# Patient Record
Sex: Female | Born: 1965 | Race: White | Hispanic: No | Smoking: Former smoker
Health system: Southern US, Community
[De-identification: ages and names within clinical notes are randomized; demographics above are authoritative.]

## PROBLEM LIST (undated history)

## (undated) DIAGNOSIS — K219 Gastro-esophageal reflux disease without esophagitis: Secondary | ICD-10-CM

## (undated) DIAGNOSIS — J45909 Unspecified asthma, uncomplicated: Secondary | ICD-10-CM

## (undated) DIAGNOSIS — J449 Chronic obstructive pulmonary disease, unspecified: Secondary | ICD-10-CM

## (undated) HISTORY — PX: TUBAL LIGATION: SHX77

## (undated) HISTORY — PX: BONY PELVIS SURGERY: SHX572

---

## 2003-05-15 ENCOUNTER — Emergency Department (HOSPITAL_COMMUNITY): Admission: EM | Admit: 2003-05-15 | Discharge: 2003-05-15 | Payer: Self-pay | Admitting: Emergency Medicine

## 2005-06-04 ENCOUNTER — Ambulatory Visit: Payer: Self-pay | Admitting: Physical Medicine & Rehabilitation

## 2005-06-04 ENCOUNTER — Inpatient Hospital Stay (HOSPITAL_COMMUNITY): Admission: EM | Admit: 2005-06-04 | Discharge: 2005-06-10 | Payer: Self-pay | Admitting: *Deleted

## 2010-03-18 ENCOUNTER — Encounter: Payer: Self-pay | Admitting: Pulmonary Disease

## 2010-03-18 DIAGNOSIS — J449 Chronic obstructive pulmonary disease, unspecified: Secondary | ICD-10-CM

## 2010-03-18 DIAGNOSIS — J45909 Unspecified asthma, uncomplicated: Secondary | ICD-10-CM | POA: Insufficient documentation

## 2010-03-18 DIAGNOSIS — J4489 Other specified chronic obstructive pulmonary disease: Secondary | ICD-10-CM

## 2010-03-18 HISTORY — DX: Chronic obstructive pulmonary disease, unspecified: J44.9

## 2010-03-18 HISTORY — DX: Other specified chronic obstructive pulmonary disease: J44.89

## 2010-03-18 HISTORY — DX: Unspecified asthma, uncomplicated: J45.909

## 2010-03-20 ENCOUNTER — Telehealth: Payer: Self-pay | Admitting: Pulmonary Disease
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
## 2012-10-11 ENCOUNTER — Encounter: Payer: Self-pay | Admitting: Orthopedic Surgery

## 2012-10-12 ENCOUNTER — Ambulatory Visit (HOSPITAL_COMMUNITY): Payer: Self-pay

## 2012-10-12 ENCOUNTER — Ambulatory Visit (HOSPITAL_COMMUNITY): Payer: Self-pay | Admitting: Anesthesiology

## 2012-10-12 ENCOUNTER — Encounter (HOSPITAL_COMMUNITY): Payer: Self-pay | Admitting: Anesthesiology

## 2012-10-12 ENCOUNTER — Encounter (HOSPITAL_COMMUNITY): Payer: Self-pay | Admitting: *Deleted

## 2012-10-12 ENCOUNTER — Ambulatory Visit (HOSPITAL_COMMUNITY)
Admission: RE | Admit: 2012-10-12 | Discharge: 2012-10-13 | Disposition: A | Discharge status: Home / Self Care | Payer: Self-pay | Source: Ambulatory Visit | Attending: Orthopedic Surgery | Admitting: Orthopedic Surgery

## 2012-10-12 ENCOUNTER — Encounter (HOSPITAL_COMMUNITY): Payer: Self-pay | Admitting: Pharmacy Technician

## 2012-10-12 ENCOUNTER — Encounter (HOSPITAL_COMMUNITY)
Admission: RE | Disposition: A | Discharge status: Home / Self Care | Payer: Self-pay | Source: Ambulatory Visit | Attending: Orthopedic Surgery

## 2012-10-12 DIAGNOSIS — J45909 Unspecified asthma, uncomplicated: Secondary | ICD-10-CM | POA: Diagnosis present

## 2012-10-12 DIAGNOSIS — S42201A Unspecified fracture of upper end of right humerus, initial encounter for closed fracture: Secondary | ICD-10-CM

## 2012-10-12 DIAGNOSIS — S42209A Unspecified fracture of upper end of unspecified humerus, initial encounter for closed fracture: Secondary | ICD-10-CM

## 2012-10-12 DIAGNOSIS — J4489 Other specified chronic obstructive pulmonary disease: Secondary | ICD-10-CM | POA: Insufficient documentation

## 2012-10-12 DIAGNOSIS — F17200 Nicotine dependence, unspecified, uncomplicated: Secondary | ICD-10-CM | POA: Insufficient documentation

## 2012-10-12 DIAGNOSIS — W19XXXA Unspecified fall, initial encounter: Secondary | ICD-10-CM | POA: Insufficient documentation

## 2012-10-12 DIAGNOSIS — S42253A Displaced fracture of greater tuberosity of unspecified humerus, initial encounter for closed fracture: Secondary | ICD-10-CM | POA: Insufficient documentation

## 2012-10-12 DIAGNOSIS — J449 Chronic obstructive pulmonary disease, unspecified: Secondary | ICD-10-CM | POA: Insufficient documentation

## 2012-10-12 HISTORY — DX: Gastro-esophageal reflux disease without esophagitis: K21.9

## 2012-10-12 HISTORY — DX: Unspecified asthma, uncomplicated: J45.909

## 2012-10-12 HISTORY — DX: Chronic obstructive pulmonary disease, unspecified: J44.9

## 2012-10-12 HISTORY — PX: ORIF HUMERUS FRACTURE: SHX2126

## 2012-10-12 LAB — URINALYSIS, ROUTINE W REFLEX MICROSCOPIC
Glucose, UA: NEGATIVE mg/dL
Ketones, ur: NEGATIVE mg/dL
Protein, ur: NEGATIVE mg/dL
pH: 5 (ref 5.0–8.0)

## 2012-10-12 LAB — SURGICAL PCR SCREEN
MRSA, PCR: NEGATIVE
Staphylococcus aureus: NEGATIVE

## 2012-10-12 LAB — CBC WITH DIFFERENTIAL/PLATELET
Eosinophils Absolute: 0.4 10*3/uL (ref 0.0–0.7)
Hemoglobin: 15.9 g/dL — ABNORMAL HIGH (ref 12.0–15.0)
Lymphocytes Relative: 33 % (ref 12–46)
Lymphs Abs: 4.8 10*3/uL — ABNORMAL HIGH (ref 0.7–4.0)
MCH: 32.9 pg (ref 26.0–34.0)
Monocytes Relative: 6 % (ref 3–12)
Neutro Abs: 8.5 10*3/uL — ABNORMAL HIGH (ref 1.7–7.7)
Neutrophils Relative %: 58 % (ref 43–77)
RBC: 4.84 MIL/uL (ref 3.87–5.11)
WBC: 14.7 10*3/uL — ABNORMAL HIGH (ref 4.0–10.5)

## 2012-10-12 LAB — URINE MICROSCOPIC-ADD ON

## 2012-10-12 LAB — COMPREHENSIVE METABOLIC PANEL
ALT: 15 U/L (ref 0–35)
Alkaline Phosphatase: 103 U/L (ref 39–117)
BUN: 18 mg/dL (ref 6–23)
CO2: 26 mEq/L (ref 19–32)
Chloride: 100 mEq/L (ref 96–112)
GFR calc Af Amer: >90 mL/min (ref >90)
Glucose, Bld: 81 mg/dL (ref 70–99)
Potassium: 3.9 mEq/L (ref 3.5–5.1)
Total Bilirubin: 0.3 mg/dL (ref 0.3–1.2)

## 2012-10-12 LAB — PROTIME-INR: Prothrombin Time: 13.2 seconds (ref 11.6–15.2)

## 2012-10-12 SURGERY — OPEN REDUCTION INTERNAL FIXATION (ORIF) PROXIMAL HUMERUS FRACTURE
Anesthesia: General | Site: Shoulder | Laterality: Right | Wound class: Clean

## 2012-10-12 MED ORDER — FLUOXETINE HCL 10 MG PO CAPS
10.0000 mg | ORAL_CAPSULE | Freq: Every day | ORAL | Status: DC
Start: 2012-10-12 — End: 2012-10-13
  Administered 2012-10-12 – 2012-10-13 (×2): 10 mg via ORAL
  Filled 2012-10-12 (×2): qty 1

## 2012-10-12 MED ORDER — HYDROMORPHONE HCL PF 1 MG/ML IJ SOLN
INTRAMUSCULAR | Status: AC
  Filled 2012-10-12: qty 1

## 2012-10-12 MED ORDER — ARTIFICIAL TEARS OP OINT
TOPICAL_OINTMENT | OPHTHALMIC | Status: DC | PRN
  Administered 2012-10-12: 1 via OPHTHALMIC

## 2012-10-12 MED ORDER — FENTANYL CITRATE 0.05 MG/ML IJ SOLN
25.0000 ug | INTRAMUSCULAR | Status: DC | PRN
  Administered 2012-10-12 (×4): 50 ug via INTRAVENOUS

## 2012-10-12 MED ORDER — LIDOCAINE HCL (CARDIAC) 20 MG/ML IV SOLN
INTRAVENOUS | Status: DC | PRN
  Administered 2012-10-12: 80 mg via INTRAVENOUS

## 2012-10-12 MED ORDER — PNEUMOCOCCAL VAC POLYVALENT 25 MCG/0.5ML IJ INJ
0.5000 mL | INJECTION | INTRAMUSCULAR | Status: DC
  Filled 2012-10-12: qty 0.5

## 2012-10-12 MED ORDER — GLYCOPYRROLATE 0.2 MG/ML IJ SOLN
INTRAMUSCULAR | Status: DC | PRN
  Administered 2012-10-12: 0.4 mg via INTRAVENOUS

## 2012-10-12 MED ORDER — LACTATED RINGERS IV SOLN: INTRAVENOUS | Status: DC

## 2012-10-12 MED ORDER — ACETAMINOPHEN 10 MG/ML IV SOLN: 1000.0000 mg | Freq: Once | INTRAVENOUS | Status: DC

## 2012-10-12 MED ORDER — HYDROMORPHONE HCL PF 1 MG/ML IJ SOLN
0.5000 mg | INTRAMUSCULAR | Status: DC | PRN
  Administered 2012-10-12 – 2012-10-13 (×3): 1 mg via INTRAVENOUS
  Filled 2012-10-12 (×3): qty 1

## 2012-10-12 MED ORDER — METHOCARBAMOL 100 MG/ML IJ SOLN
500.0000 mg | Freq: Once | INTRAVENOUS | Status: DC
  Filled 2012-10-12: qty 5

## 2012-10-12 MED ORDER — LACTATED RINGERS IV SOLN
INTRAVENOUS | Status: DC | PRN
  Administered 2012-10-12 (×2): via INTRAVENOUS

## 2012-10-12 MED ORDER — ONDANSETRON HCL 4 MG PO TABS: 4.0000 mg | ORAL_TABLET | Freq: Four times a day (QID) | ORAL | Status: DC | PRN

## 2012-10-12 MED ORDER — METOCLOPRAMIDE HCL 5 MG/ML IJ SOLN
5.0000 mg | Freq: Three times a day (TID) | INTRAMUSCULAR | Status: DC | PRN
  Administered 2012-10-12: 10 mg via INTRAVENOUS
  Filled 2012-10-12: qty 2

## 2012-10-12 MED ORDER — LIDOCAINE HCL 4 % MT SOLN
OROMUCOSAL | Status: DC | PRN
  Administered 2012-10-12: 4 mL via TOPICAL

## 2012-10-12 MED ORDER — ALBUMIN HUMAN 5 % IV SOLN
INTRAVENOUS | Status: DC | PRN
  Administered 2012-10-12: 09:00:00 via INTRAVENOUS

## 2012-10-12 MED ORDER — INFLUENZA VIRUS VACC SPLIT PF IM SUSP
0.5000 mL | INTRAMUSCULAR | Status: DC
  Filled 2012-10-12: qty 0.5

## 2012-10-12 MED ORDER — CEFAZOLIN SODIUM 1-5 GM-% IV SOLN
1.0000 g | Freq: Three times a day (TID) | INTRAVENOUS | Status: AC
  Administered 2012-10-12 – 2012-10-13 (×3): 1 g via INTRAVENOUS
  Filled 2012-10-12 (×4): qty 50

## 2012-10-12 MED ORDER — MUPIROCIN 2 % EX OINT
TOPICAL_OINTMENT | CUTANEOUS | Status: AC
  Filled 2012-10-12: qty 22

## 2012-10-12 MED ORDER — OXYCODONE-ACETAMINOPHEN 5-325 MG PO TABS
1.0000 | ORAL_TABLET | Freq: Four times a day (QID) | ORAL | Status: DC | PRN
  Administered 2012-10-12 – 2012-10-13 (×2): 2 via ORAL
  Filled 2012-10-12 (×2): qty 2

## 2012-10-12 MED ORDER — HYDROMORPHONE HCL PF 1 MG/ML IJ SOLN
INTRAMUSCULAR | Status: AC
  Administered 2012-10-12: 14:00:00
  Filled 2012-10-12: qty 1

## 2012-10-12 MED ORDER — CEFAZOLIN SODIUM-DEXTROSE 2-3 GM-% IV SOLR
INTRAVENOUS | Status: AC
  Administered 2012-10-12: 2000 mg
  Filled 2012-10-12: qty 50

## 2012-10-12 MED ORDER — ALBUTEROL SULFATE (5 MG/ML) 0.5% IN NEBU: 2.5000 mg | INHALATION_SOLUTION | Freq: Four times a day (QID) | RESPIRATORY_TRACT | Status: DC | PRN

## 2012-10-12 MED ORDER — HYDROXYZINE HCL 25 MG PO TABS: 50.0000 mg | ORAL_TABLET | Freq: Three times a day (TID) | ORAL | Status: DC | PRN

## 2012-10-12 MED ORDER — CEFAZOLIN SODIUM-DEXTROSE 2-3 GM-% IV SOLR
2.0000 g | INTRAVENOUS | Status: AC
  Administered 2012-10-12: 2 g via INTRAVENOUS

## 2012-10-12 MED ORDER — METHOCARBAMOL 500 MG PO TABS
500.0000 mg | ORAL_TABLET | Freq: Four times a day (QID) | ORAL | Status: DC
  Administered 2012-10-12: 500 mg via ORAL
  Administered 2012-10-12 – 2012-10-13 (×2): 1000 mg via ORAL
  Filled 2012-10-12 (×6): qty 2
  Filled 2012-10-12: qty 1

## 2012-10-12 MED ORDER — FENTANYL CITRATE 0.05 MG/ML IJ SOLN
INTRAMUSCULAR | Status: AC
Start: 2012-10-12 — End: 2012-10-12
  Filled 2012-10-12: qty 2

## 2012-10-12 MED ORDER — FENTANYL CITRATE 0.05 MG/ML IJ SOLN
INTRAMUSCULAR | Status: AC
  Filled 2012-10-12: qty 2

## 2012-10-12 MED ORDER — 0.9 % SODIUM CHLORIDE (POUR BTL) OPTIME
TOPICAL | Status: DC | PRN
  Administered 2012-10-12: 1000 mL

## 2012-10-12 MED ORDER — ALBUTEROL SULFATE HFA 108 (90 BASE) MCG/ACT IN AERS
INHALATION_SPRAY | RESPIRATORY_TRACT | Status: DC | PRN
  Administered 2012-10-12: 5 via RESPIRATORY_TRACT

## 2012-10-12 MED ORDER — MUPIROCIN 2 % EX OINT
TOPICAL_OINTMENT | Freq: Once | CUTANEOUS | Status: AC
  Administered 2012-10-12: 07:00:00 via NASAL

## 2012-10-12 MED ORDER — ALUM & MAG HYDROXIDE-SIMETH 200-200-20 MG/5ML PO SUSP: 30.0000 mL | ORAL | Status: DC | PRN

## 2012-10-12 MED ORDER — PHENYLEPHRINE HCL 10 MG/ML IJ SOLN
INTRAMUSCULAR | Status: DC | PRN
  Administered 2012-10-12 (×3): 40 ug via INTRAVENOUS
  Administered 2012-10-12: 80 ug via INTRAVENOUS

## 2012-10-12 MED ORDER — MIDAZOLAM HCL 5 MG/5ML IJ SOLN
INTRAMUSCULAR | Status: DC | PRN
  Administered 2012-10-12: 2 mg via INTRAVENOUS

## 2012-10-12 MED ORDER — ROCURONIUM BROMIDE 100 MG/10ML IV SOLN
INTRAVENOUS | Status: DC | PRN
  Administered 2012-10-12: 50 mg via INTRAVENOUS

## 2012-10-12 MED ORDER — MENTHOL 3 MG MT LOZG: 1.0000 | LOZENGE | OROMUCOSAL | Status: DC | PRN

## 2012-10-12 MED ORDER — ACETAMINOPHEN 10 MG/ML IV SOLN
INTRAVENOUS | Status: AC
  Administered 2012-10-12: 1000 mg via INTRAVENOUS
  Filled 2012-10-12: qty 100

## 2012-10-12 MED ORDER — PHENOL 1.4 % MT LIQD: 1.0000 | OROMUCOSAL | Status: DC | PRN

## 2012-10-12 MED ORDER — POTASSIUM CHLORIDE IN NACL 20-0.9 MEQ/L-% IV SOLN
INTRAVENOUS | Status: DC
  Administered 2012-10-12: 22:00:00 via INTRAVENOUS
  Filled 2012-10-12 (×2): qty 1000

## 2012-10-12 MED ORDER — OXYCODONE HCL 5 MG PO TABS
ORAL_TABLET | ORAL | Status: AC
  Filled 2012-10-12: qty 1

## 2012-10-12 MED ORDER — OXYCODONE HCL 5 MG PO TABS
5.0000 mg | ORAL_TABLET | ORAL | Status: DC | PRN
  Administered 2012-10-12 (×2): 15 mg via ORAL
  Administered 2012-10-12: 5 mg via ORAL
  Administered 2012-10-13 (×3): 15 mg via ORAL
  Filled 2012-10-12 (×5): qty 3

## 2012-10-12 MED ORDER — VECURONIUM BROMIDE 10 MG IV SOLR
INTRAVENOUS | Status: DC | PRN
  Administered 2012-10-12 (×2): 3 mg via INTRAVENOUS

## 2012-10-12 MED ORDER — METHOCARBAMOL 100 MG/ML IJ SOLN
500.0000 mg | Freq: Four times a day (QID) | INTRAVENOUS | Status: DC
  Administered 2012-10-12 – 2012-10-13 (×2): 500 mg via INTRAVENOUS
  Filled 2012-10-12 (×6): qty 5

## 2012-10-12 MED ORDER — PROPOFOL 10 MG/ML IV BOLUS
INTRAVENOUS | Status: DC | PRN
  Administered 2012-10-12: 170 mg via INTRAVENOUS

## 2012-10-12 MED ORDER — FENTANYL CITRATE 0.05 MG/ML IJ SOLN
INTRAMUSCULAR | Status: DC | PRN
  Administered 2012-10-12 (×8): 50 ug via INTRAVENOUS

## 2012-10-12 MED ORDER — ONDANSETRON HCL 4 MG/2ML IJ SOLN
INTRAMUSCULAR | Status: DC | PRN
  Administered 2012-10-12: 4 mg via INTRAVENOUS

## 2012-10-12 MED ORDER — ALBUTEROL SULFATE HFA 108 (90 BASE) MCG/ACT IN AERS
2.0000 | INHALATION_SPRAY | RESPIRATORY_TRACT | Status: DC | PRN
  Administered 2012-10-12: 2 via RESPIRATORY_TRACT

## 2012-10-12 MED ORDER — METOCLOPRAMIDE HCL 5 MG PO TABS
5.0000 mg | ORAL_TABLET | Freq: Three times a day (TID) | ORAL | Status: DC | PRN
  Filled 2012-10-12: qty 2

## 2012-10-12 MED ORDER — GENTAMICIN IN SALINE 1.6-0.9 MG/ML-% IV SOLN
80.0000 mg | Freq: Once | INTRAVENOUS | Status: AC
  Administered 2012-10-12: 80 mg via INTRAVENOUS
  Filled 2012-10-12: qty 50

## 2012-10-12 MED ORDER — NEOSTIGMINE METHYLSULFATE 1 MG/ML IJ SOLN
INTRAMUSCULAR | Status: DC | PRN
  Administered 2012-10-12: 3 mg via INTRAVENOUS

## 2012-10-12 MED ORDER — DOCUSATE SODIUM 100 MG PO CAPS
100.0000 mg | ORAL_CAPSULE | Freq: Two times a day (BID) | ORAL | Status: DC
  Administered 2012-10-12 (×2): 100 mg via ORAL
  Filled 2012-10-12 (×4): qty 1

## 2012-10-12 MED ORDER — ONDANSETRON HCL 4 MG/2ML IJ SOLN
4.0000 mg | Freq: Four times a day (QID) | INTRAMUSCULAR | Status: DC | PRN
  Administered 2012-10-12 – 2012-10-13 (×2): 4 mg via INTRAVENOUS
  Filled 2012-10-12 (×2): qty 2

## 2012-10-12 SURGICAL SUPPLY — 65 items
APL SKNCLS STERI-STRIP NONHPOA (GAUZE/BANDAGES/DRESSINGS) ×1
BANDAGE GAUZE ELAST BULKY 4 IN (GAUZE/BANDAGES/DRESSINGS) ×2 IMPLANT
BENZOIN TINCTURE PRP APPL 2/3 (GAUZE/BANDAGES/DRESSINGS) ×3 IMPLANT
BIT DRILL 2.5X2.75 QC CALB (BIT) ×1 IMPLANT
BONE CHIP PRESERV 20CC (Bone Implant) ×1 IMPLANT
BRUSH SCRUB DISP (MISCELLANEOUS) ×4 IMPLANT
CLOTH BEACON ORANGE TIMEOUT ST (SAFETY) ×2 IMPLANT
COVER SURGICAL LIGHT HANDLE (MISCELLANEOUS) ×3 IMPLANT
DRAPE C-ARM 42X72 X-RAY (DRAPES) ×2 IMPLANT
DRAPE C-ARMOR (DRAPES) ×1 IMPLANT
DRAPE INCISE IOBAN 66X45 STRL (DRAPES) IMPLANT
DRAPE ORTHO SPLIT 77X108 STRL (DRAPES) ×4
DRAPE SURG 17X11 SM STRL (DRAPES) ×3 IMPLANT
DRAPE SURG ORHT 6 SPLT 77X108 (DRAPES) ×2 IMPLANT
DRAPE U-SHAPE 47X51 STRL (DRAPES) ×3 IMPLANT
DRILL BIT 5/64 (BIT) ×1 IMPLANT
DRSG ADAPTIC 3X8 NADH LF (GAUZE/BANDAGES/DRESSINGS) ×1 IMPLANT
DRSG MEPILEX BORDER 4X8 (GAUZE/BANDAGES/DRESSINGS) ×1 IMPLANT
DRSG PAD ABDOMINAL 8X10 ST (GAUZE/BANDAGES/DRESSINGS) ×1 IMPLANT
ELECT REM PT RETURN 9FT ADLT (ELECTROSURGICAL) ×2
ELECTRODE REM PT RTRN 9FT ADLT (ELECTROSURGICAL) ×1 IMPLANT
EVACUATOR 1/8 PVC DRAIN (DRAIN) IMPLANT
GLOVE BIO SURGEON STRL SZ7.5 (GLOVE) ×2 IMPLANT
GLOVE BIO SURGEON STRL SZ8 (GLOVE) ×2 IMPLANT
GLOVE BIOGEL PI IND STRL 7.5 (GLOVE) ×1 IMPLANT
GLOVE BIOGEL PI IND STRL 8 (GLOVE) ×1 IMPLANT
GLOVE BIOGEL PI INDICATOR 7.5 (GLOVE) ×1
GLOVE BIOGEL PI INDICATOR 8 (GLOVE) ×1
GOWN PREVENTION PLUS XLARGE (GOWN DISPOSABLE) ×2 IMPLANT
GOWN PREVENTION PLUS XXLARGE (GOWN DISPOSABLE) ×2 IMPLANT
GOWN STRL NON-REIN LRG LVL3 (GOWN DISPOSABLE) ×3 IMPLANT
KIT BASIN OR (CUSTOM PROCEDURE TRAY) ×2 IMPLANT
KIT ROOM TURNOVER OR (KITS) ×2 IMPLANT
LOOP VESSEL MAXI BLUE (MISCELLANEOUS) IMPLANT
MANIFOLD NEPTUNE II (INSTRUMENTS) ×1 IMPLANT
NS IRRIG 1000ML POUR BTL (IV SOLUTION) ×2 IMPLANT
PACK TOTAL JOINT (CUSTOM PROCEDURE TRAY) ×2 IMPLANT
PAD ARMBOARD 7.5X6 YLW CONV (MISCELLANEOUS) ×4 IMPLANT
PLATE ACE LRG OFST SPDR 20XLWR (Plate) IMPLANT
PLATE ACE SPIDER (Plate) ×2 IMPLANT
RETRIEVER SUT HEWSON (MISCELLANEOUS) ×1 IMPLANT
SCREW CORT FT 32X3.5XNONLOCK (Screw) IMPLANT
SCREW CORTICAL 3.5MM  32MM (Screw) ×1 IMPLANT
SCREW CORTICAL 3.5MM 32MM (Screw) ×1 IMPLANT
SPONGE GAUZE 4X4 12PLY (GAUZE/BANDAGES/DRESSINGS) ×4 IMPLANT
SPONGE LAP 18X18 X RAY DECT (DISPOSABLE) IMPLANT
STAPLER VISISTAT 35W (STAPLE) ×2 IMPLANT
STOCKINETTE IMPERVIOUS LG (DRAPES) ×1 IMPLANT
SUCTION FRAZIER TIP 10 FR DISP (SUCTIONS) ×2 IMPLANT
SUT ETHIBOND 5 LR DA (SUTURE) ×1 IMPLANT
SUT ETHILON 3 0 PS 1 (SUTURE) ×1 IMPLANT
SUT FIBERWIRE #2 38 T-5 BLUE (SUTURE) ×6
SUT PDS AB 2-0 CT1 27 (SUTURE) IMPLANT
SUT VIC AB 0 CT1 27 (SUTURE) ×2
SUT VIC AB 0 CT1 27XBRD ANBCTR (SUTURE) ×2 IMPLANT
SUT VIC AB 2-0 CT1 27 (SUTURE) ×2
SUT VIC AB 2-0 CT1 TAPERPNT 27 (SUTURE) ×2 IMPLANT
SUT VIC AB 2-0 CT3 27 (SUTURE) IMPLANT
SUTURE FIBERWR #2 38 T-5 BLUE (SUTURE) IMPLANT
SYR 5ML LL (SYRINGE) IMPLANT
TOWEL OR 17X24 6PK STRL BLUE (TOWEL DISPOSABLE) ×2 IMPLANT
TOWEL OR 17X26 10 PK STRL BLUE (TOWEL DISPOSABLE) ×4 IMPLANT
TRAY FOLEY CATH 14FR (SET/KITS/TRAYS/PACK) IMPLANT
WATER STERILE IRR 1000ML POUR (IV SOLUTION) ×2 IMPLANT
YANKAUER SUCT BULB TIP NO VENT (SUCTIONS) IMPLANT

## 2012-10-12 NOTE — Anesthesia Postprocedure Evaluation (Signed)
  Anesthesia Post-op Note  Patient: [name]  Procedure(s) Performed: Procedure(s): OPEN REDUCTION INTERNAL FIXATION (ORIF) PROXIMAL HUMERUS FRACTURE (Right)  Patient Location: PACU  Anesthesia Type:General  Level of Consciousness: awake  Airway and Oxygen Therapy: Patient Spontanous Breathing  Post-op Pain: mild  Post-op Assessment: Post-op Vital signs reviewed  Post-op Vital Signs: Reviewed  Complications: No apparent anesthesia complications

## 2012-10-12 NOTE — Anesthesia Procedure Notes (Signed)
Procedure Name: Intubation Date/Time: 10/12/2012 8:04 AM Performed by: Lovie Chol Pre-anesthesia Checklist: Patient identified, Emergency Drugs available, Suction available, Patient being monitored and Timeout performed Patient Re-evaluated:Patient Re-evaluated prior to inductionOxygen Delivery Method: Circle system utilized Preoxygenation: Pre-oxygenation with 100% oxygen Intubation Type: IV induction Ventilation: Mask ventilation without difficulty Laryngoscope Size: Miller and 2 Grade View: Grade I Tube type: Oral Tube size: 7.0 mm Number of attempts: 1 Airway Equipment and Method: Stylet and LTA kit utilized Placement Confirmation: ETT inserted through vocal cords under direct vision,  positive ETCO2,  CO2 detector and breath sounds checked- equal and bilateral Secured at: 22 cm Tube secured with: Tape Dental Injury: Teeth and Oropharynx as per pre-operative assessment

## 2012-10-12 NOTE — Preoperative (Signed)
Beta Blockers   Reason not to administer Beta Blockers:Not Applicable 
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
## 2012-10-12 NOTE — Anesthesia Preprocedure Evaluation (Addendum)
Anesthesia Evaluation  Patient identified by MRN, date of birth, ID band Patient awake    Reviewed: Allergy & Precautions, H&P , NPO status , Patient's Chart, lab work & pertinent test results  History of Anesthesia Complications Negative for: history of anesthetic complications  Airway Mallampati: II TM Distance: >3 FB Neck ROM: Full    Dental  (+) Teeth Intact and Dental Advisory Given   Pulmonary shortness of breath, with exertion and at rest, asthma , COPD COPD inhaler,  Patient reports asthma attack this am -- used nebulizer. breath sounds clear to auscultation        Cardiovascular negative cardio ROS  Rhythm:Regular Rate:Normal     Neuro/Psych negative neurological ROS     GI/Hepatic Neg liver ROS, GERD-  Controlled,  Endo/Other  negative endocrine ROS  Renal/GU negative Renal ROS  negative genitourinary   Musculoskeletal   Abdominal   Peds  Hematology negative hematology ROS (+)   Anesthesia Other Findings   Reproductive/Obstetrics Perimenopause per patient.                        Anesthesia Physical Anesthesia Plan  ASA: III  Anesthesia Plan: General   Post-op Pain Management:    Induction: Intravenous  Airway Management Planned: Oral ETT  Additional Equipment:   Intra-op Plan:   Post-operative Plan: Extubation in OR  Informed Consent: I have reviewed the patients History and Physical, chart, labs and discussed the procedure including the risks, benefits and alternatives for the proposed anesthesia with the patient or authorized representative who has indicated his/her understanding and acceptance.   Dental advisory given  Plan Discussed with: CRNA and Anesthesiologist  Anesthesia Plan Comments:        Anesthesia Quick Evaluation

## 2012-10-12 NOTE — Progress Notes (Signed)
Pt still having c/o pain 10/10, Dr.Edwards notified, order rec'd to give additional fentanyl and states she will com to eval patient, will cont to assess

## 2012-10-12 NOTE — Progress Notes (Signed)
Dr. Randa Evens notified about the results of EKG.

## 2012-10-12 NOTE — Transfer of Care (Signed)
Immediate Anesthesia Transfer of Care Note  Patient: [name]  Procedure(s) Performed: Procedure(s): OPEN REDUCTION INTERNAL FIXATION (ORIF) PROXIMAL HUMERUS FRACTURE (Right)  Patient Location: PACU  Anesthesia Type:General  Level of Consciousness: awake, alert , oriented and patient cooperative  Airway & Oxygen Therapy: Patient Spontanous Breathing and Patient connected to nasal cannula oxygen  Post-op Assessment: Report given to PACU RN and Post -op Vital signs reviewed and stable  Post vital signs: Reviewed and stable  Complications: No apparent anesthesia complications

## 2012-10-12 NOTE — Brief Op Note (Signed)
10/12/2012  10:49 AM  PATIENT:  [name]  47 y.o. female  PRE-OPERATIVE DIAGNOSIS:  right proximal humerus fracture  POST-OPERATIVE DIAGNOSIS:  right proximal humerus fracture  PROCEDURE:  Procedure(s): OPEN REDUCTION INTERNAL FIXATION (ORIF) PROXIMAL HUMERUS FRACTURE (Right)  SURGEON:  Surgeon(s) and Role:    * Budd Palmer, MD - Primary  PHYSICIAN ASSISTANT: Montez Morita, [facility]  ANESTHESIA:   general  EBL:  Total I/O In: 1250 [I.V.:1000; IV Piggyback:250] Out: -   BLOOD ADMINISTERED:none  DRAINS: none   LOCAL MEDICATIONS USED:  NONE  SPECIMEN:  No Specimen  DISPOSITION OF SPECIMEN:  N/A  COUNTS:  YES  TOURNIQUET:  * No tourniquets in log *  DICTATION: .Other Dictation: Dictation Number [id number]  PLAN OF CARE: Admit for overnight observation  PATIENT DISPOSITION:  PACU - hemodynamically stable.   Delay start of Pharmacological VTE agent (>24hrs) due to surgical blood loss or risk of bleeding: no

## 2012-10-13 LAB — URINE DRUGS OF ABUSE SCREEN W ALC, ROUTINE (REF LAB)
Amphetamine Screen, Ur: NEGATIVE
Barbiturate Quant, Ur: NEGATIVE
Benzodiazepines.: NEGATIVE
Cocaine Metabolites: NEGATIVE
Creatinine,U: 213 mg/dL
Ethyl Alcohol: <10 mg/dL (ref <10)
Marijuana Metabolite: POSITIVE — AB
Methadone: NEGATIVE
Opiate Screen, Urine: NEGATIVE
Phencyclidine (PCP): NEGATIVE
Propoxyphene: NEGATIVE

## 2012-10-13 LAB — URINE CULTURE
Colony Count: NO GROWTH
Culture: NO GROWTH

## 2012-10-13 MED ORDER — METHOCARBAMOL 500 MG PO TABS: 500.0000 mg | ORAL_TABLET | Freq: Four times a day (QID) | ORAL | Status: DC

## 2012-10-13 MED ORDER — OXYCODONE HCL 5 MG PO TABS: 5.0000 mg | ORAL_TABLET | ORAL | Status: DC | PRN

## 2012-10-13 MED ORDER — OXYCODONE-ACETAMINOPHEN 5-325 MG PO TABS: 1.0000 | ORAL_TABLET | Freq: Four times a day (QID) | ORAL | Status: AC | PRN

## 2012-10-13 NOTE — Evaluation (Signed)
Occupational Therapy Evaluation Patient Details Name: [name] MRN: [id number] DOB: [birth year] Today's Date: 10/13/2012 Time: 7829-5621 OT Time Calculation (min): 36 min  OT Assessment / Plan / Recommendation Clinical Impression  Pt is a 47 yr old female admitted secondary to fall and subsequently a right humeral fracture.  Pt overall needs min asssit for basic selfcare tasks secondary to RUE limitations.  Will have assistance from family at discharge.  She has been educated on positioning, sling wear, selfcare, AROM for non-involved joints and Pendulum exercises.  No further acute care OT needs.    OT Assessment  Progress rehab of shoulder as ordered by MD at follow-up appointment    Follow Up Recommendations  No OT follow up       Equipment Recommendations  None recommended by OT          Precautions / Restrictions Precautions Precaution Comments: shoulder sling at all times except for exeercise and selfcare Required Braces or Orthoses: Other Brace/Splint Other Brace/Splint: sling Restrictions Weight Bearing Restrictions: Yes RUE Weight Bearing: Non weight bearing   Pertinent Vitals/Pain HR 93 and O2 sats 95% on room air    ADL  Eating/Feeding: Performed;Modified independent Where Assessed - Eating/Feeding: Chair Grooming: Simulated;Minimal assistance Where Assessed - Grooming: Unsupported standing Upper Body Bathing: Simulated;Minimal assistance Where Assessed - Upper Body Bathing: Unsupported sitting Lower Body Bathing: Simulated;Supervision/safety Where Assessed - Lower Body Bathing: Supported sit to stand Upper Body Dressing: Simulated;Minimal assistance Where Assessed - Upper Body Dressing: Unsupported sitting Lower Body Dressing: Simulated;Supervision/safety Where Assessed - Lower Body Dressing: Unsupported sit to stand Toilet Transfer: Simulated;Independent Acupuncturist: Regular height toilet Toileting - Clothing Manipulation and Hygiene:  Simulated;Independent Where Assessed - Toileting Clothing Manipulation and Hygiene: Sit to stand from 3-in-1 or toilet Tub/Shower Transfer: Simulated;Independent Equipment Used: Other (comment) (sling) Transfers/Ambulation Related to ADLs: Pt is currently independent with mobility without need for assistive device. ADL Comments: Pt/family have been educated on positioning, sling wear, selfcare tasks, Pendulums, and AROM exercises for the RUE.  Also encouraged ice for pain throughout the day.  No further needs.      Visit Information  Last OT Received On: 10/13/12 Assistance Needed: +1    Subjective Data  Subjective: I want to get home, I'm tired of the hospital. Patient Stated Goal: Did not state   Prior Functioning     Home Living Lives With: Family Available Help at Discharge: Family Type of Home: Mobile home Home Access: Stairs to enter Secretary/administrator of Steps: 2 Entrance Stairs-Rails: None Home Layout: One level Bathroom Shower/Tub: Engineer, manufacturing systems: Standard Home Adaptive Equipment: None Prior Function Level of Independence: Independent Able to Take Stairs?: Yes Driving: Yes Communication Communication: No difficulties Dominant Hand: Right         Vision/Perception Vision - History Baseline Vision: No visual deficits Patient Visual Report: No change from baseline Vision - Assessment Eye Alignment: Within Functional Limits Vision Assessment: Vision not tested Perception Perception: Within Functional Limits Praxis Praxis: Intact   Cognition  Cognition Overall Cognitive Status: Appears within functional limits for tasks assessed/performed Arousal/Alertness: Awake/alert Orientation Level: Appears intact for tasks assessed Behavior During Session: [facility] for tasks performed Cognition - Other Comments: pt very anxious    Extremity/Trunk Assessment Right Upper Extremity Assessment RUE ROM/Strength/Tone: Unable to fully assess;Due to  pain;Due to precautions;Deficits RUE ROM/Strength/Tone Deficits: pt with UE in sling.  Full wrist and hand AROM noted.  AROM elbow flexion -30-120 degrees currently RUE Sensation: WFL - Light Touch RUE  Coordination: WFL - gross/fine motor Left Upper Extremity Assessment LUE ROM/Strength/Tone: Within functional levels LUE Sensation: WFL - Light Touch LUE Coordination: WFL - gross/fine motor Trunk Assessment Trunk Assessment: Normal     Mobility Bed Mobility Bed Mobility: Supine to Sit Supine to Sit: 6: Modified independent (Device/Increase time) Transfers Transfers: Sit to Stand;Stand to Sit Sit to Stand: 7: Independent;Without upper extremity assist;From bed Stand to Sit: 7: Independent;Without upper extremity assist;To bed     Exercise Shoulder Exercises Pendulum Exercise: PROM;Right;15 reps;Standing Elbow Flexion: AAROM;Right;15 reps;Standing Wrist Flexion: AROM;15 reps;Standing;Right Wrist Extension: AROM;Right;15 reps;Standing Neck Lateral Flexion - Left: AROM;5 reps;Seated Donning/doffing shirt without moving shoulder: Caregiver independent with task Method for sponge bathing under operated UE: Caregiver independent with task Donning/doffing sling/immobilizer: Caregiver independent with task Correct positioning of sling/immobilizer: Caregiver independent with task Pendulum exercises (written home exercise program): Independent ROM for elbow, wrist and digits of operated UE: Independent Sling wearing schedule (on at all times/off for ADL's): Independent;Caregiver independent with task Proper positioning of operated UE when showering: Independent;Caregiver independent with task Dressing change:  (Nursing addressing) Positioning of UE while sleeping: Caregiver independent with task   Balance Balance Balance Assessed: Yes Dynamic Standing Balance Dynamic Standing - Balance Support: No upper extremity supported Dynamic Standing - Level of Assistance: 5: Stand by  assistance;Other (comment) (Pt slightly groggy likley due to pain meds. )   End of Session OT - End of Session Activity Tolerance: Patient limited by pain Patient left: in bed;with call bell/phone within reach Nurse Communication: Mobility status (Completion of OT education)  GO Functional Assessment Tool Used: clinical judgement Functional Limitation: Self care Self Care Current Status (Z6109): At least 1 percent but less than 20 percent impaired, limited or restricted Self Care Goal Status (U0454): At least 1 percent but less than 20 percent impaired, limited or restricted Self Care Discharge Status [phone]): At least 1 percent but less than 20 percent impaired, limited or restricted   Carrianne Hyun OTR/L Pager number [phone] 10/13/2012, 10:37 AM

## 2012-10-13 NOTE — Op Note (Signed)
[name], [name]                 ACCOUNT NO.:  [id number]  MEDICAL RECORD NO.:  [id number]  LOCATION:  [id number]                        FACILITY:  [facility]  PHYSICIAN:  Doralee Albino. Carola Frost, M.D. DATE OF BIRTH:  06-22-1966  DATE OF PROCEDURE: DATE OF DISCHARGE:                              OPERATIVE REPORT   PREOPERATIVE DIAGNOSIS:  Displaced right proximal humerus fracture including greater tuberosity and impaction of the humeral head at the anatomic neck.  POSTOPERATIVE DIAGNOSIS:  Displaced right proximal humerus fracture including greater tuberosity and impaction of the humeral head at the anatomic neck.  PROCEDURE:  Open reduction and internal fixation of right proximal humerus.  SURGEON:  Doralee Albino. Carola Frost, M.D.  ASSISTANT:  Mearl Latin, PA-C  ANESTHESIA:  General.  COMPLICATIONS:  None.  ESTIMATED BLOOD LOSS:  Minimal.  DISPOSITION:  To PACU.  CONDITION:  Stable.  BRIEF SUMMARY OF INDICATIONS FOR PROCEDURE:  The patient is a right-hand dominant 47 year old heavy smoker with severe emphysema who sustained a right proximal humerus fracture reportedly 3 weeks ago.  She was seen and evaluated by an outside orthopedic surgeon, and referred for further management given the complexity of the fracture pattern with impaction of the humeral head and significant posterior and superior displacement of the tuberosity.  I did discuss with the patient the risks and benefits of attempted repair versus nonsurgical versus hemiarthroplasty and recommended ORIF given the displacement of the greater tuberosity, which would likely be a blocked motion.  She did wish to proceed and understood those complications to include prolonged intubation, exacerbation of her COPD, heart attack, stroke, nerve injury, vessel injury, DVT, PE, malunion, nonunion, and loss of motion as well as arthritis among others.  BRIEF SUMMARY OF PROCEDURE:  [name] was taken to the operating room and positioned  supine with a towel under her right shoulder blade after induction of general anesthesia.  She did receive preoperative antibiotics.  Standard prep and drape was then performed.  Deltopectoral approach was made through a 5.5 cm incision.  The interval was identified and then the clavipectoral fascia was incised to reveal the bursa and underneath that the rotator cuff.  The biceps tendon was identified within the groove and traced proximally.  I did release some of the proximal and lateral portion of the pec insertion and very small amount of the proximal and more medial deltoid insertion.  This allowed for internal rotation and delivery of the greater tuberosity segment. It had essentially united in this malreduced position.  A Cobb and osteotome was used to free it back up and then it was mobilized distally and anteriorly.  The humeral head was elevated out of excessive valgus into appropriate position using a combination of large tamps and then 20 mL of cancellous graft.  As the greater tuberosity segment was brought forward, it did fragment.  We are able to achieve an excellent integrity of repair using multiple #2 FiberWire sutures with Mason-Allen technique incorporating both bone fragments and rotator cuff into this repair and tying them back through bone tunnels in the lesser tuberosity.  The biceps tendon was left in the groove and the most distal segment of the greater tuberosity  fragment was further buttressed with a 3.5 cortical screw and spider plate from [employer].  The arm was taken through a range of motion without any motion of the fragments and excellent on the table motion.  Wound was irrigated thoroughly and then closed in standard layered fashion after x-rays confirmed appropriate restoration of the neck-shaft angle and apposition of the greater tuberosity to the head and shaft segment.  Montez Morita, PA-C did assist me throughout the procedure with exposure, mobilization  and closure.  PROGNOSIS:  [name] will have pendulum motion for the next 2 weeks with passive motion and then active assisted, and ultimately active motion around the 8-week mark.  She is at very high risk for nonunion given the smoking, but this was mitigated by a soft tissue friendly dissection and repair, in which, we found vascular segments and did not release any soft tissue attachments to the bone fragments.     Doralee Albino. Carola Frost, M.D.     [facility]MED[facility]  D:  10/12/2012  T:  10/13/2012  Job:  [id number]

## 2012-10-13 NOTE — Discharge Summary (Signed)
[facility] (OTS)  Patient ID: [name] MRN: [id number] DOB/AGE: [birth year] 47 y.o.  Admit date: 10/12/2012 Discharge date: 10/13/2012  Admission Diagnoses:R proximal humerus fracture, COPD  Discharge Diagnoses:  Active Problems:   ASTHMA   C O P D   Proximal humerus fracture, right   Procedures Performed: ORIF R proximal humerus  Discharged Condition: good  Hospital Course: Admitted postoperatively for pain control and did receive OT/PT prior to d/c home. No complications.  Consults: None  Treatments: perioperative Ancef and also gent for UTI prophylaxis given UA with marginal results  Discharge Exam: UEx shoulder drsg c/d/i; sling in place  elbow, wrist, digits- no skin wounds, nontender, no instability, no blocks to motion  Sens  Ax/R/M/U intact  Mot    R/ PIN/ M/ AIN/ U intact  Rad 2+  Disposition: To home today, stable     Medication List    ASK your doctor about these medications       albuterol 108 (90 BASE) MCG/ACT inhaler  Commonly known as:  PROVENTIL HFA;VENTOLIN HFA  Inhale 2 puffs into the lungs every 4 (four) hours as needed for wheezing.     albuterol (2.5 MG/3ML) 0.083% nebulizer solution  Commonly known as:  PROVENTIL  Take 2.5 mg by nebulization every 4 (four) hours as needed for wheezing or shortness of breath.     FLUoxetine 10 MG capsule  Commonly known as:  PROZAC  Take 10 mg by mouth daily.     oxyCODONE-acetaminophen 5-325 MG per tablet  Commonly known as:  PERCOCET/ROXICET  Take 2 tablets by mouth every 6 (six) hours as needed for pain.      Oxycodone 5mg  Take 1-2 for breakthrough pain   Discharge Instructions and Plan: Pendulum motion of R shoulder May shower in 24-48 hrs and leave open to air Ice frequently for next 72 hours May wish to sleep upright in recliner or with pillows No smoking   Signed: Myrene Galas, MD [facility] [phone] (p)   10/13/2012,  8:38 AM

## 2012-10-13 NOTE — Progress Notes (Signed)
D/C instructions and scripts given. Pt verbalized understanding of instructions. Family at bedside to take pt home.

## 2012-10-14 ENCOUNTER — Encounter (HOSPITAL_COMMUNITY): Payer: Self-pay | Admitting: Orthopedic Surgery

## 2012-10-14 LAB — THC (MARIJUANA), URINE, CONFIRMATION: Marijuana, Ur-Confirmation: 72 ng/mL

## 2012-11-10 ENCOUNTER — Ambulatory Visit: Payer: Self-pay | Attending: Orthopedic Surgery | Admitting: Physical Therapy

## 2012-11-10 DIAGNOSIS — M25519 Pain in unspecified shoulder: Secondary | ICD-10-CM | POA: Insufficient documentation

## 2012-11-10 DIAGNOSIS — M25619 Stiffness of unspecified shoulder, not elsewhere classified: Secondary | ICD-10-CM | POA: Insufficient documentation

## 2012-11-10 DIAGNOSIS — IMO0001 Reserved for inherently not codable concepts without codable children: Secondary | ICD-10-CM | POA: Insufficient documentation

## 2012-11-16 ENCOUNTER — Ambulatory Visit: Payer: Self-pay | Attending: Orthopedic Surgery | Admitting: Rehabilitation

## 2012-11-16 DIAGNOSIS — IMO0001 Reserved for inherently not codable concepts without codable children: Secondary | ICD-10-CM | POA: Insufficient documentation

## 2012-11-16 DIAGNOSIS — M25519 Pain in unspecified shoulder: Secondary | ICD-10-CM | POA: Insufficient documentation

## 2012-11-16 DIAGNOSIS — M25619 Stiffness of unspecified shoulder, not elsewhere classified: Secondary | ICD-10-CM | POA: Insufficient documentation

## 2012-11-18 ENCOUNTER — Ambulatory Visit: Payer: Self-pay | Admitting: Rehabilitation

## 2012-11-20 ENCOUNTER — Encounter (HOSPITAL_COMMUNITY): Payer: Self-pay | Admitting: Emergency Medicine

## 2012-11-20 ENCOUNTER — Emergency Department (HOSPITAL_COMMUNITY)
Admission: EM | Admit: 2012-11-20 | Discharge: 2012-11-21 | Disposition: A | Discharge status: Home / Self Care | Payer: Self-pay | Attending: Emergency Medicine | Admitting: Emergency Medicine

## 2012-11-20 DIAGNOSIS — Z87891 Personal history of nicotine dependence: Secondary | ICD-10-CM | POA: Insufficient documentation

## 2012-11-20 DIAGNOSIS — R112 Nausea with vomiting, unspecified: Secondary | ICD-10-CM | POA: Insufficient documentation

## 2012-11-20 DIAGNOSIS — Z79899 Other long term (current) drug therapy: Secondary | ICD-10-CM | POA: Insufficient documentation

## 2012-11-20 DIAGNOSIS — N390 Urinary tract infection, site not specified: Secondary | ICD-10-CM | POA: Insufficient documentation

## 2012-11-20 DIAGNOSIS — Z8719 Personal history of other diseases of the digestive system: Secondary | ICD-10-CM | POA: Insufficient documentation

## 2012-11-20 DIAGNOSIS — G8918 Other acute postprocedural pain: Secondary | ICD-10-CM | POA: Insufficient documentation

## 2012-11-20 DIAGNOSIS — M7989 Other specified soft tissue disorders: Secondary | ICD-10-CM | POA: Insufficient documentation

## 2012-11-20 DIAGNOSIS — J45909 Unspecified asthma, uncomplicated: Secondary | ICD-10-CM | POA: Insufficient documentation

## 2012-11-20 DIAGNOSIS — J4489 Other specified chronic obstructive pulmonary disease: Secondary | ICD-10-CM | POA: Insufficient documentation

## 2012-11-20 DIAGNOSIS — IMO0001 Reserved for inherently not codable concepts without codable children: Secondary | ICD-10-CM | POA: Insufficient documentation

## 2012-11-20 DIAGNOSIS — R000 Tachycardia, unspecified: Secondary | ICD-10-CM | POA: Insufficient documentation

## 2012-11-20 DIAGNOSIS — J449 Chronic obstructive pulmonary disease, unspecified: Secondary | ICD-10-CM | POA: Insufficient documentation

## 2012-11-20 LAB — CBC WITH DIFFERENTIAL/PLATELET
Basophils Absolute: 0 10*3/uL (ref 0.0–0.1)
Eosinophils Absolute: 0.2 10*3/uL (ref 0.0–0.7)
Lymphocytes Relative: 24 % (ref 12–46)
Lymphs Abs: 3.7 10*3/uL (ref 0.7–4.0)
MCHC: 34.6 g/dL (ref 30.0–36.0)
Monocytes Relative: 11 % (ref 3–12)
Neutro Abs: 9.7 10*3/uL — ABNORMAL HIGH (ref 1.7–7.7)
Platelets: 340 10*3/uL (ref 150–400)
RDW: 12.4 % (ref 11.5–15.5)
WBC: 15.3 10*3/uL — ABNORMAL HIGH (ref 4.0–10.5)

## 2012-11-20 LAB — COMPREHENSIVE METABOLIC PANEL
AST: 10 U/L (ref 0–37)
Albumin: 3.3 g/dL — ABNORMAL LOW (ref 3.5–5.2)
BUN: 11 mg/dL (ref 6–23)
Chloride: 101 mEq/L (ref 96–112)
Creatinine, Ser: 0.67 mg/dL (ref 0.50–1.10)
Potassium: 3 mEq/L — ABNORMAL LOW (ref 3.5–5.1)
Total Bilirubin: 0.2 mg/dL — ABNORMAL LOW (ref 0.3–1.2)
Total Protein: 7.4 g/dL (ref 6.0–8.3)

## 2012-11-20 NOTE — ED Notes (Signed)
PT. REPORTS ON AND OFF FEVER FOR SEVERAL DAYS WITH CHILLS. ALSO REPORTS BILATERAL EAR ACHE.  DENIES COUGH OR CONGESTION .

## 2012-11-21 ENCOUNTER — Emergency Department (HOSPITAL_COMMUNITY): Payer: Self-pay

## 2012-11-21 LAB — URINE MICROSCOPIC-ADD ON

## 2012-11-21 LAB — URINALYSIS, ROUTINE W REFLEX MICROSCOPIC
Glucose, UA: NEGATIVE mg/dL
pH: 6 (ref 5.0–8.0)

## 2012-11-21 LAB — POCT I-STAT, CHEM 8
BUN: 11 mg/dL (ref 6–23)
Chloride: 101 mEq/L (ref 96–112)
Creatinine, Ser: 0.7 mg/dL (ref 0.50–1.10)
Potassium: 3.3 mEq/L — ABNORMAL LOW (ref 3.5–5.1)
Sodium: 140 mEq/L (ref 135–145)

## 2012-11-21 LAB — SEDIMENTATION RATE: Sed Rate: 27 mm/hr — ABNORMAL HIGH (ref 0–22)

## 2012-11-21 LAB — C-REACTIVE PROTEIN: CRP: 23.7 mg/dL — ABNORMAL HIGH (ref <0.60)

## 2012-11-21 MED ORDER — NITROFURANTOIN MONOHYD MACRO 100 MG PO CAPS
100.0000 mg | ORAL_CAPSULE | Freq: Once | ORAL | Status: AC
  Administered 2012-11-21: 100 mg via ORAL
  Filled 2012-11-21: qty 1

## 2012-11-21 MED ORDER — MORPHINE SULFATE 4 MG/ML IJ SOLN
4.0000 mg | Freq: Once | INTRAMUSCULAR | Status: AC
  Administered 2012-11-21: 4 mg via INTRAMUSCULAR
  Filled 2012-11-21: qty 1

## 2012-11-21 MED ORDER — ONDANSETRON HCL 8 MG PO TABS
8.0000 mg | ORAL_TABLET | Freq: Once | ORAL | Status: AC
  Administered 2012-11-21: 8 mg via ORAL
  Filled 2012-11-21: qty 1

## 2012-11-21 MED ORDER — NITROFURANTOIN MONOHYD MACRO 100 MG PO CAPS: 100.0000 mg | ORAL_CAPSULE | Freq: Once | ORAL | Status: AC

## 2012-11-21 MED ORDER — ONDANSETRON HCL 8 MG PO TABS
4.0000 mg | ORAL_TABLET | Freq: Once | ORAL | Status: DC
  Filled 2012-11-21: qty 2

## 2012-11-21 MED ORDER — ONDANSETRON HCL 4 MG PO TABS: 4.0000 mg | ORAL_TABLET | Freq: Three times a day (TID) | ORAL | Status: AC | PRN

## 2012-11-21 MED ORDER — ONDANSETRON HCL 8 MG PO TABS
8.0000 mg | ORAL_TABLET | Freq: Once | ORAL | Status: AC
  Administered 2012-11-21: 8 mg via ORAL

## 2012-11-21 MED ORDER — POTASSIUM CHLORIDE CRYS ER 20 MEQ PO TBCR
40.0000 meq | EXTENDED_RELEASE_TABLET | Freq: Once | ORAL | Status: AC
  Administered 2012-11-21: 40 meq via ORAL
  Filled 2012-11-21: qty 2

## 2012-11-21 NOTE — ED Provider Notes (Signed)
History     CSN: [id number]  Arrival date & time 11/20/12  2134   First MD Initiated Contact with Patient 11/20/12 2340      Chief Complaint  Patient presents with  . Fever    (Consider location/radiation/quality/duration/timing/severity/associated sxs/prior treatment) HPI Comments: Patient presents tonight with 5 days of intermittent fevers to 101.5, headache, nausea, and vomiting.  She also reports, that she had surgery on her right humerus with plates placed 5 weeks, ago.  She started physical therapy, and she has noticed a "cold" at the, distal aspect of the surgical scar without redness, or tenderness.  She called her surgeon, who requested she come to the emergency department for evaluation of her fever.  Patient is a 47 y.o. female presenting with fever. The history is provided by the patient.  Fever Temp source:  Oral Severity:  Moderate Duration:  5 days Timing:  Intermittent Chronicity:  New Relieved by:  Acetaminophen Associated symptoms: chills, myalgias, nausea and vomiting   Associated symptoms: no congestion, no cough, no diarrhea, no dysuria, no rash, no rhinorrhea and no sore throat     Past Medical History  Diagnosis Date  . ASTHMA 03/18/2010  . C O P D 03/18/2010  . GERD (gastroesophageal reflux disease)     Past Surgical History  Procedure Laterality Date  . Tubal ligation    . Bony pelvis surgery      7 yrs  . Orif humerus fracture Right 10/12/2012    Procedure: OPEN REDUCTION INTERNAL FIXATION (ORIF) PROXIMAL HUMERUS FRACTURE;  Surgeon: Budd Palmer, MD;  Location: [facility] OR;  Service: Orthopedics;  Laterality: Right;    No family history on file.  History  Substance Use Topics  . Smoking status: Former Smoker -- 0.50 packs/day    Quit date: 09/22/2012  . Smokeless tobacco: Not on file  . Alcohol Use: Yes     Comment: occ beer    OB History   Grav Para Term Preterm Abortions TAB SAB Ect Mult Living                  Review of Systems    Constitutional: Positive for fever and chills.  HENT: Negative for congestion, sore throat, rhinorrhea, trouble swallowing and sinus pressure.   Respiratory: Negative for cough and shortness of breath.   Gastrointestinal: Positive for nausea and vomiting. Negative for abdominal pain and diarrhea.  Genitourinary: Negative for dysuria.  Musculoskeletal: Positive for myalgias.  Skin: Negative for rash and wound.  Neurological: Negative for weakness and numbness.  All other systems reviewed and are negative.    Allergies  Ceftriaxone sodium and Codeine  Home Medications   Current Outpatient Rx  Name  Route  Sig  Dispense  Refill  . albuterol (PROVENTIL HFA;VENTOLIN HFA) 108 (90 BASE) MCG/ACT inhaler   Inhalation   Inhale 2 puffs into the lungs every 4 (four) hours as needed for wheezing.         Marland Kitchen albuterol (PROVENTIL) (2.5 MG/3ML) 0.083% nebulizer solution   Nebulization   Take 2.5 mg by nebulization every 4 (four) hours as needed for wheezing or shortness of breath.         Marland Kitchen FLUoxetine (PROZAC) 10 MG capsule   Oral   Take 10 mg by mouth daily.         . Fluticasone-Salmeterol (ADVAIR) 500-50 MCG/DOSE AEPB   Inhalation   Inhale 1 puff into the lungs daily.         Marland Kitchen oxyCODONE-acetaminophen (PERCOCET/ROXICET)  5-325 MG per tablet   Oral   Take 1-2 tablets by mouth every 6 (six) hours as needed for pain.   80 tablet   0   . nitrofurantoin, macrocrystal-monohydrate, (MACROBID) 100 MG capsule   Oral   Take 1 capsule (100 mg total) by mouth once.   9 capsule   0   . ondansetron (ZOFRAN) 4 MG tablet   Oral   Take 1 tablet (4 mg total) by mouth every 8 (eight) hours as needed for nausea.   20 tablet   0     BP 97/64  Pulse 93  Temp(Src) 99.1 F (37.3 C) (Oral)  Resp 18  SpO2 95%  Physical Exam  Nursing note and vitals reviewed. Constitutional: She is oriented to person, place, and time. She appears well-developed and well-nourished.  HENT:  Head:  Normocephalic and atraumatic.  Left Ear: External ear normal.  Neck: Normal range of motion. Neck supple.  Cardiovascular: Regular rhythm.  Tachycardia present.   Pulmonary/Chest: Effort normal.  Abdominal: Soft. Bowel sounds are normal. She exhibits no distension. There is no tenderness.  Musculoskeletal:       Right upper arm: She exhibits tenderness and swelling. She exhibits no bony tenderness, no deformity and no laceration.       Arms: Neurological: She is alert and oriented to person, place, and time.  Skin: Skin is warm and dry. No erythema.    ED Course  Procedures (including critical care time)  Labs Reviewed  CBC WITH DIFFERENTIAL - Abnormal; Notable for the following:    WBC 15.3 (*)    RBC 3.81 (*)    HCT 35.6 (*)    Neutro Abs 9.7 (*)    Monocytes Absolute 1.7 (*)    All other components within normal limits  COMPREHENSIVE METABOLIC PANEL - Abnormal; Notable for the following:    Potassium 3.0 (*)    Albumin 3.3 (*)    Total Bilirubin 0.2 (*)    All other components within normal limits  URINALYSIS, ROUTINE W REFLEX MICROSCOPIC - Abnormal; Notable for the following:    APPearance CLOUDY (*)    Hgb urine dipstick MODERATE (*)    Protein, ur 30 (*)    Leukocytes, UA SMALL (*)    All other components within normal limits  URINE MICROSCOPIC-ADD ON - Abnormal; Notable for the following:    Squamous Epithelial / LPF MANY (*)    Bacteria, UA MANY (*)    All other components within normal limits  SEDIMENTATION RATE - Abnormal; Notable for the following:    Sed Rate 27 (*)    All other components within normal limits  POCT I-STAT, CHEM 8 - Abnormal; Notable for the following:    Potassium 3.3 (*)    Glucose, Bld 131 (*)    All other components within normal limits  URINE CULTURE  CBC WITH DIFFERENTIAL  C-REACTIVE PROTEIN   Dg Humerus Right  11/21/2012  *RADIOLOGY REPORT*  Clinical Data: History of right humeral head fracture 5 weeks previously.  Fever and chills  with puffy swelling below the surgical scar.  RIGHT HUMERUS - 2+ VIEW  Comparison: 10/12/2012  Findings: Diffuse bone demineralization.  Comminuted fractures of the right proximal humerus involving the surgical neck.  Screw fixation of the distal fracture.  There is sclerosis in the fracture line likely representing changes of healing versus impaction.  Mild periosteal reaction is likely due to healing. Healing change made be similar changes of osteomyelitis and no osteomyelitis is  not entirely excluded.  No radiopaque soft tissue foreign bodies or gas collections.  Healing fractures of the right posterior sixth and seventh ribs.  IMPRESSION: Fractures of the right proximal humerus with screw fixation. Sclerosis and periosteal reaction is likely due to fracture healing but such changes can overlap those of osteomyelitis and osteomyelitis is not entirely excluded.  No soft tissue gas or soft tissue foreign body demonstrated.  Healing fractures of right posterior ribs.   Original Report Authenticated By: Burman Nieves, M.D.      1. Urinary tract infection   2. Post-operative pain       MDM  Spoke with Dr. Fredric Mare is PA or reviewed.  Her x-rays.  No need for MRI or CT imaging at this time.  They would like to see the patient.  In the office on Monday for reevaluation of her surgical site and  healing         Arman Filter, NP 11/21/12 [phone]

## 2012-11-21 NOTE — ED Notes (Signed)
Family at bedside. 

## 2012-11-21 NOTE — ED Provider Notes (Signed)
Medical screening examination/treatment/procedure(s) were performed by non-physician practitioner and as supervising physician I was immediately available for consultation/collaboration.  Annette Liotta, MD 11/21/12 0656 

## 2012-11-23 ENCOUNTER — Ambulatory Visit: Payer: Self-pay | Admitting: Rehabilitation

## 2012-11-24 LAB — URINE CULTURE

## 2012-11-25 ENCOUNTER — Ambulatory Visit: Payer: Self-pay | Admitting: Physical Therapy

## 2012-11-25 ENCOUNTER — Telehealth (HOSPITAL_COMMUNITY): Payer: Self-pay | Admitting: Emergency Medicine

## 2012-11-30 ENCOUNTER — Ambulatory Visit: Payer: Self-pay | Admitting: Rehabilitation

## 2012-12-02 ENCOUNTER — Ambulatory Visit: Payer: Self-pay | Admitting: Rehabilitation

## 2012-12-13 ENCOUNTER — Ambulatory Visit: Payer: Self-pay | Admitting: Physical Therapy

## 2012-12-15 ENCOUNTER — Ambulatory Visit: Payer: Self-pay | Admitting: Physical Therapy

## 2012-12-24 ENCOUNTER — Other Ambulatory Visit: Payer: Self-pay | Admitting: Orthopedic Surgery

## 2012-12-24 DIAGNOSIS — M25511 Pain in right shoulder: Secondary | ICD-10-CM

## 2012-12-30 ENCOUNTER — Other Ambulatory Visit: Payer: Self-pay

## 2013-01-03 ENCOUNTER — Ambulatory Visit
Admission: RE | Admit: 2013-01-03 | Discharge: 2013-01-03 | Disposition: A | Discharge status: Home / Self Care | Payer: No Typology Code available for payment source | Source: Ambulatory Visit | Attending: Orthopedic Surgery | Admitting: Orthopedic Surgery

## 2013-01-03 DIAGNOSIS — M25511 Pain in right shoulder: Secondary | ICD-10-CM

## 2013-01-03 MED ORDER — METHYLPREDNISOLONE ACETATE 40 MG/ML INJ SUSP (RADIOLOG
120.0000 mg | Freq: Once | INTRAMUSCULAR | Status: AC
  Administered 2013-01-03: 120 mg via INTRA_ARTICULAR

## 2013-01-03 MED ORDER — IOHEXOL 180 MG/ML  SOLN
1.0000 mL | Freq: Once | INTRAMUSCULAR | Status: AC | PRN
  Administered 2013-01-03: 1 mL via INTRA_ARTICULAR

## 2013-01-05 ENCOUNTER — Ambulatory Visit: Payer: Self-pay | Admitting: Physical Therapy

## 2013-01-18 ENCOUNTER — Ambulatory Visit: Payer: Self-pay | Attending: Orthopedic Surgery

## 2013-01-18 DIAGNOSIS — M25619 Stiffness of unspecified shoulder, not elsewhere classified: Secondary | ICD-10-CM | POA: Insufficient documentation

## 2013-01-18 DIAGNOSIS — IMO0001 Reserved for inherently not codable concepts without codable children: Secondary | ICD-10-CM | POA: Insufficient documentation

## 2013-01-18 DIAGNOSIS — M25519 Pain in unspecified shoulder: Secondary | ICD-10-CM | POA: Insufficient documentation

## 2013-01-24 ENCOUNTER — Ambulatory Visit: Payer: Self-pay

## 2013-01-31 ENCOUNTER — Ambulatory Visit: Payer: Self-pay

## 2013-02-28 ENCOUNTER — Ambulatory Visit: Payer: Self-pay | Attending: Orthopedic Surgery | Admitting: Physical Therapy

## 2013-02-28 DIAGNOSIS — M25519 Pain in unspecified shoulder: Secondary | ICD-10-CM | POA: Insufficient documentation

## 2013-02-28 DIAGNOSIS — IMO0001 Reserved for inherently not codable concepts without codable children: Secondary | ICD-10-CM | POA: Insufficient documentation

## 2013-02-28 DIAGNOSIS — M25619 Stiffness of unspecified shoulder, not elsewhere classified: Secondary | ICD-10-CM | POA: Insufficient documentation

## 2013-03-02 ENCOUNTER — Ambulatory Visit: Payer: Self-pay | Admitting: Physical Therapy

## 2013-08-22 IMAGING — RF DG HUMERUS 2V *R*
1 series · 3 of 3 positions shown · non-contrast
Comparison: Chest radiograph - 10/12/2012

CLINICAL DATA: ORIF of the right humerus

RIGHT HUMERUS - 2+ VIEW

[Series 1: run · 3 of 3 slices shown]
[im 1/3]
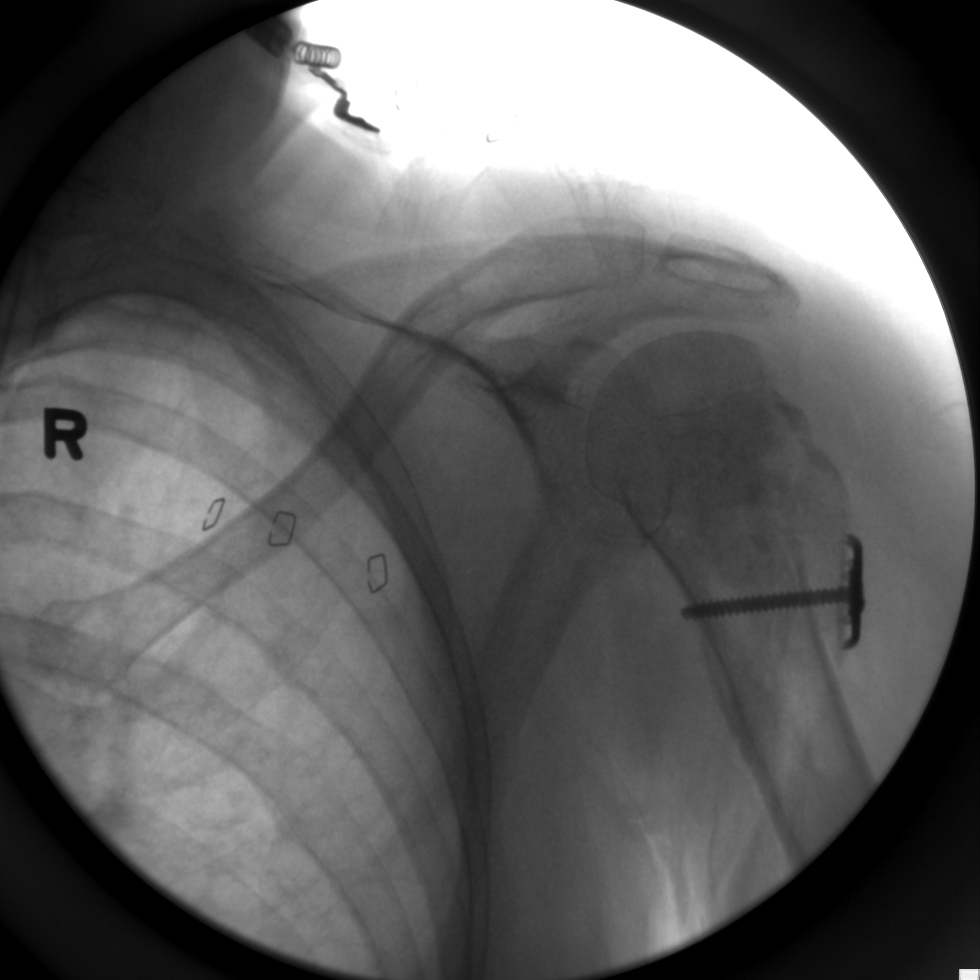
[im 2/3]
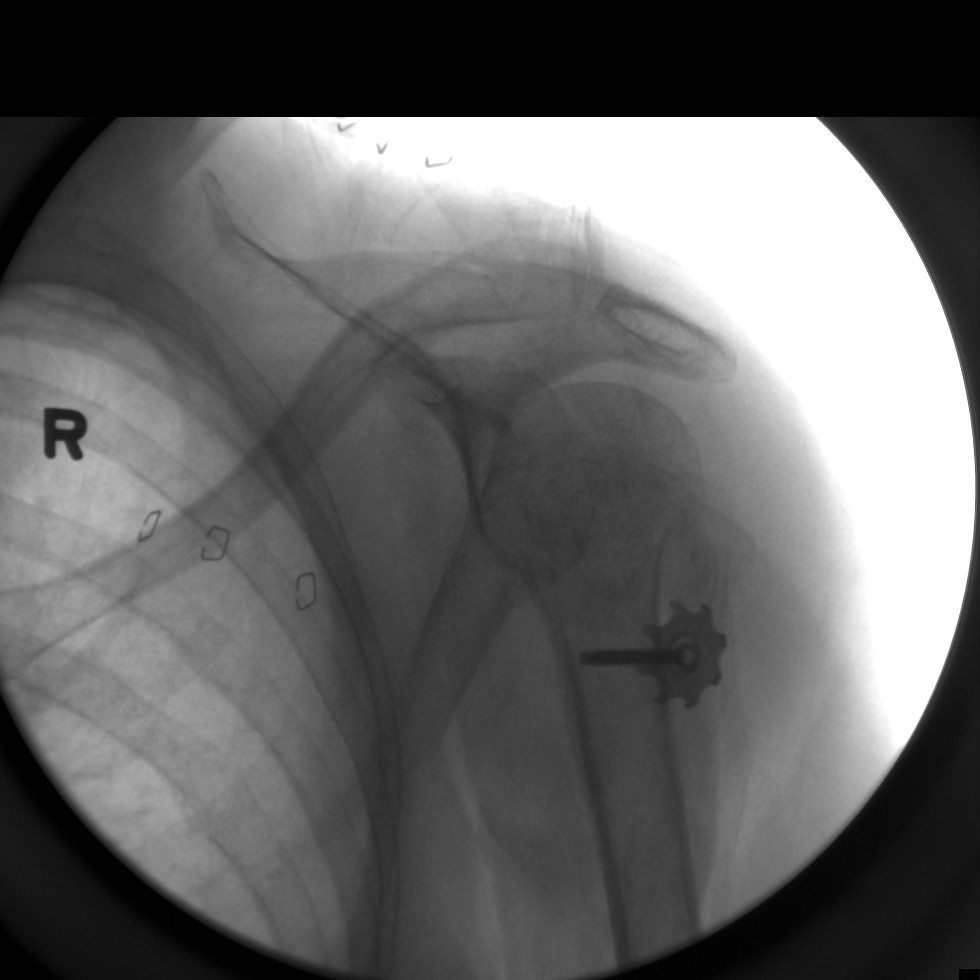
[im 3/3]
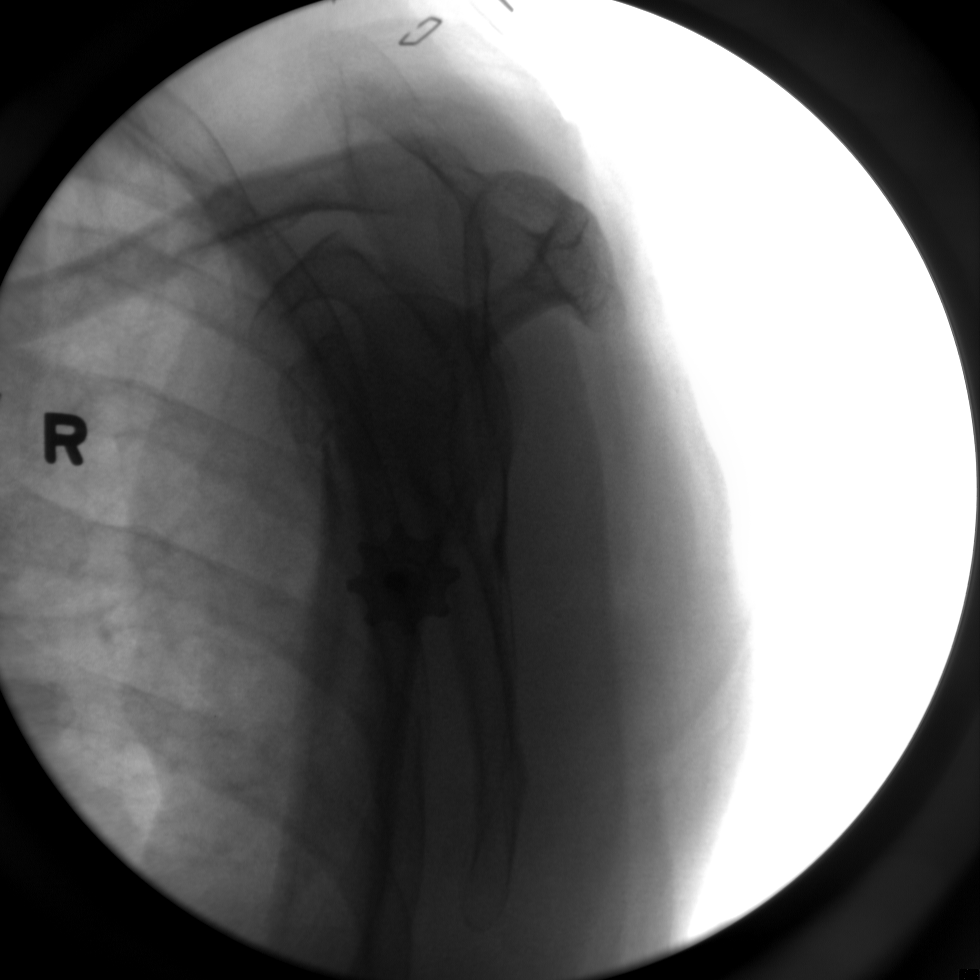

[3 of 3 positions shown; findings below may reference images not displayed]

FINDINGS: Three spot intraoperative fluoroscopic images of the proximal
aspect of the right humerus are provided for review. Fine bony
detail is degraded secondary to technique.

Images demonstrate a single cancellous screw transfixing the
inferior aspect of the previously identified comminuted, displaced
impaction fracture involving the proximal humeral metadiaphysis.
The greater tuberosity remains persistently displaced inferiorly.
IMPRESSION: Post ORIF of the proximal humerus as above.

## 2013-08-22 IMAGING — CR DG CHEST 2V
2 series · 2 of 2 positions shown · non-contrast
Comparison: 06/05/2005

CLINICAL DATA: Preop radiograph.  Asthma exacerbation.

CHEST - 2 VIEW

[w chest pa]
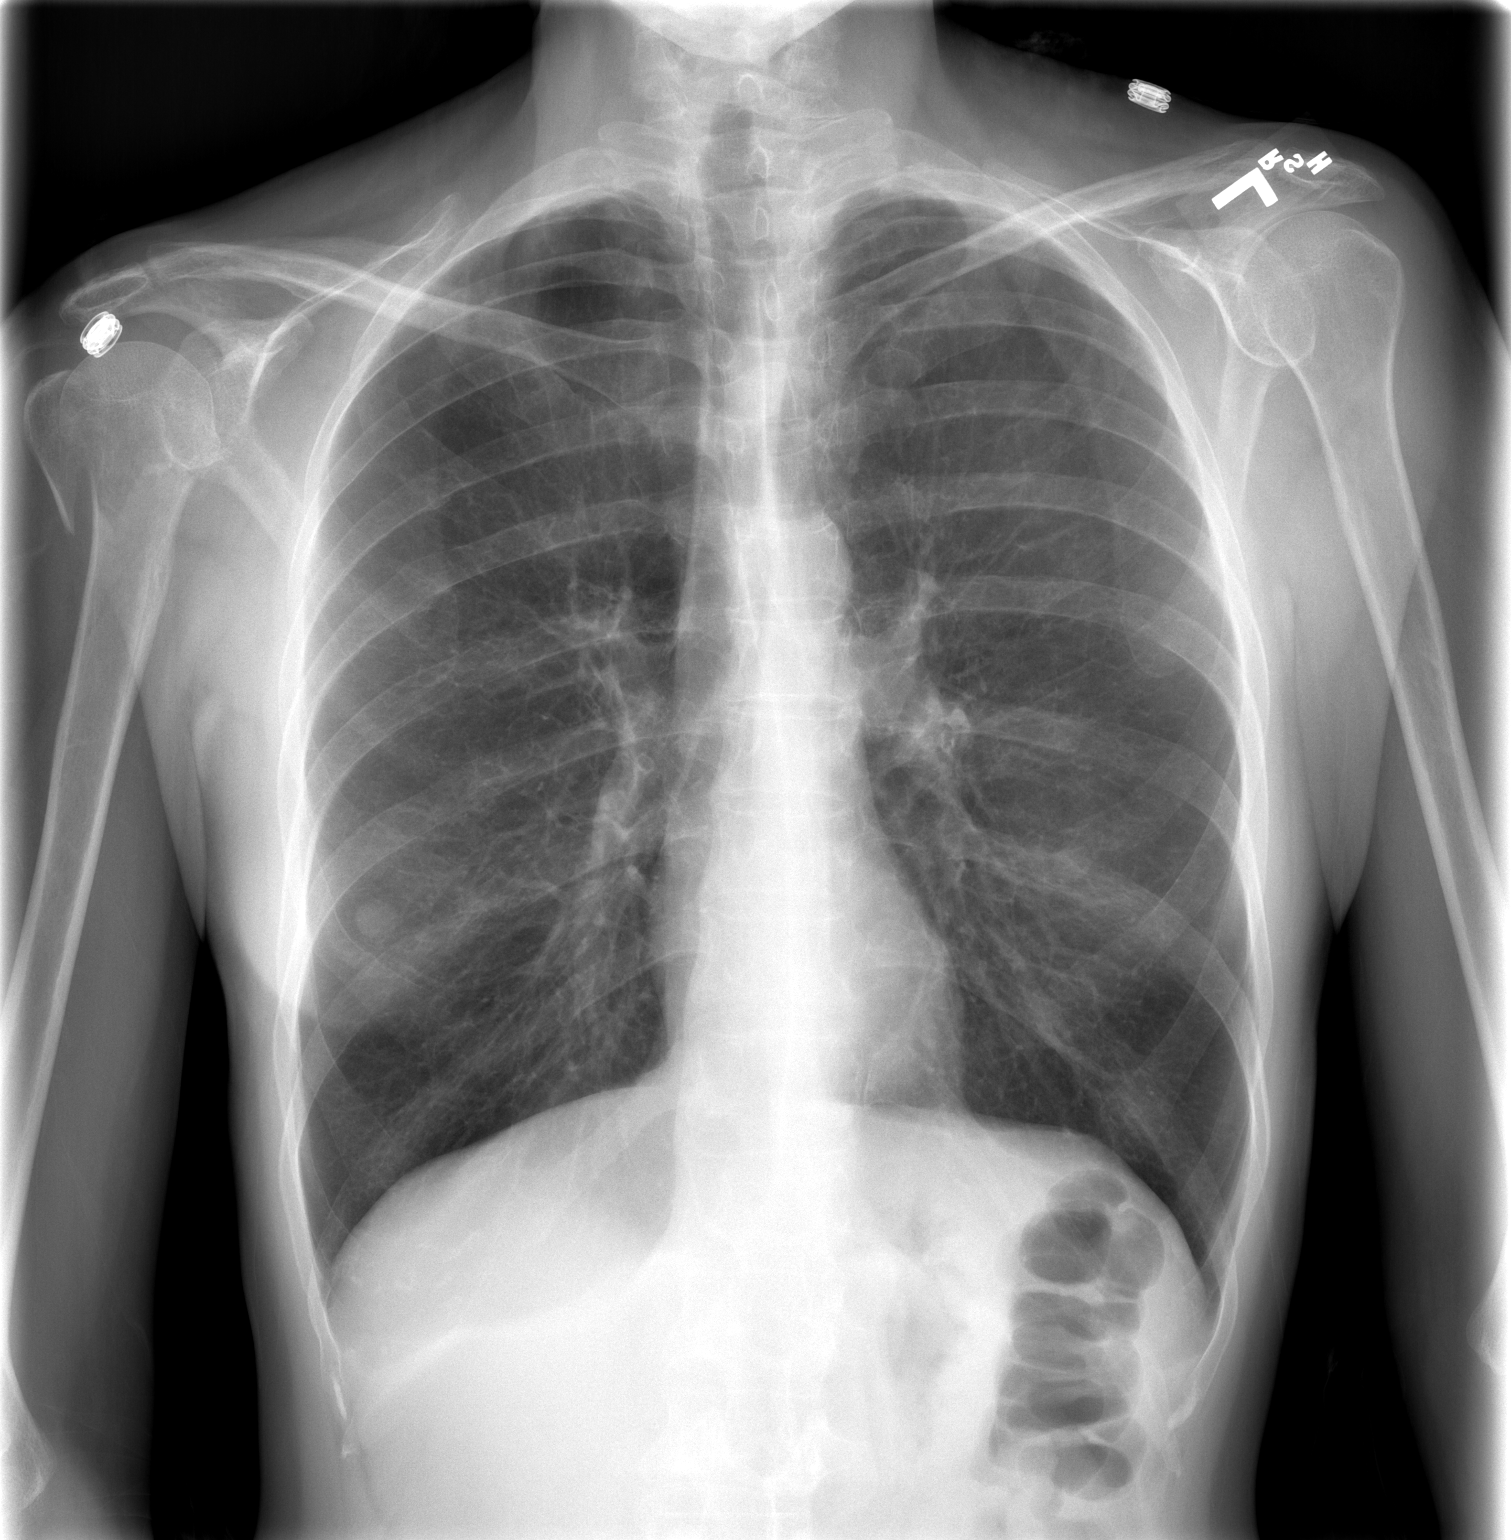

[w chest lat]
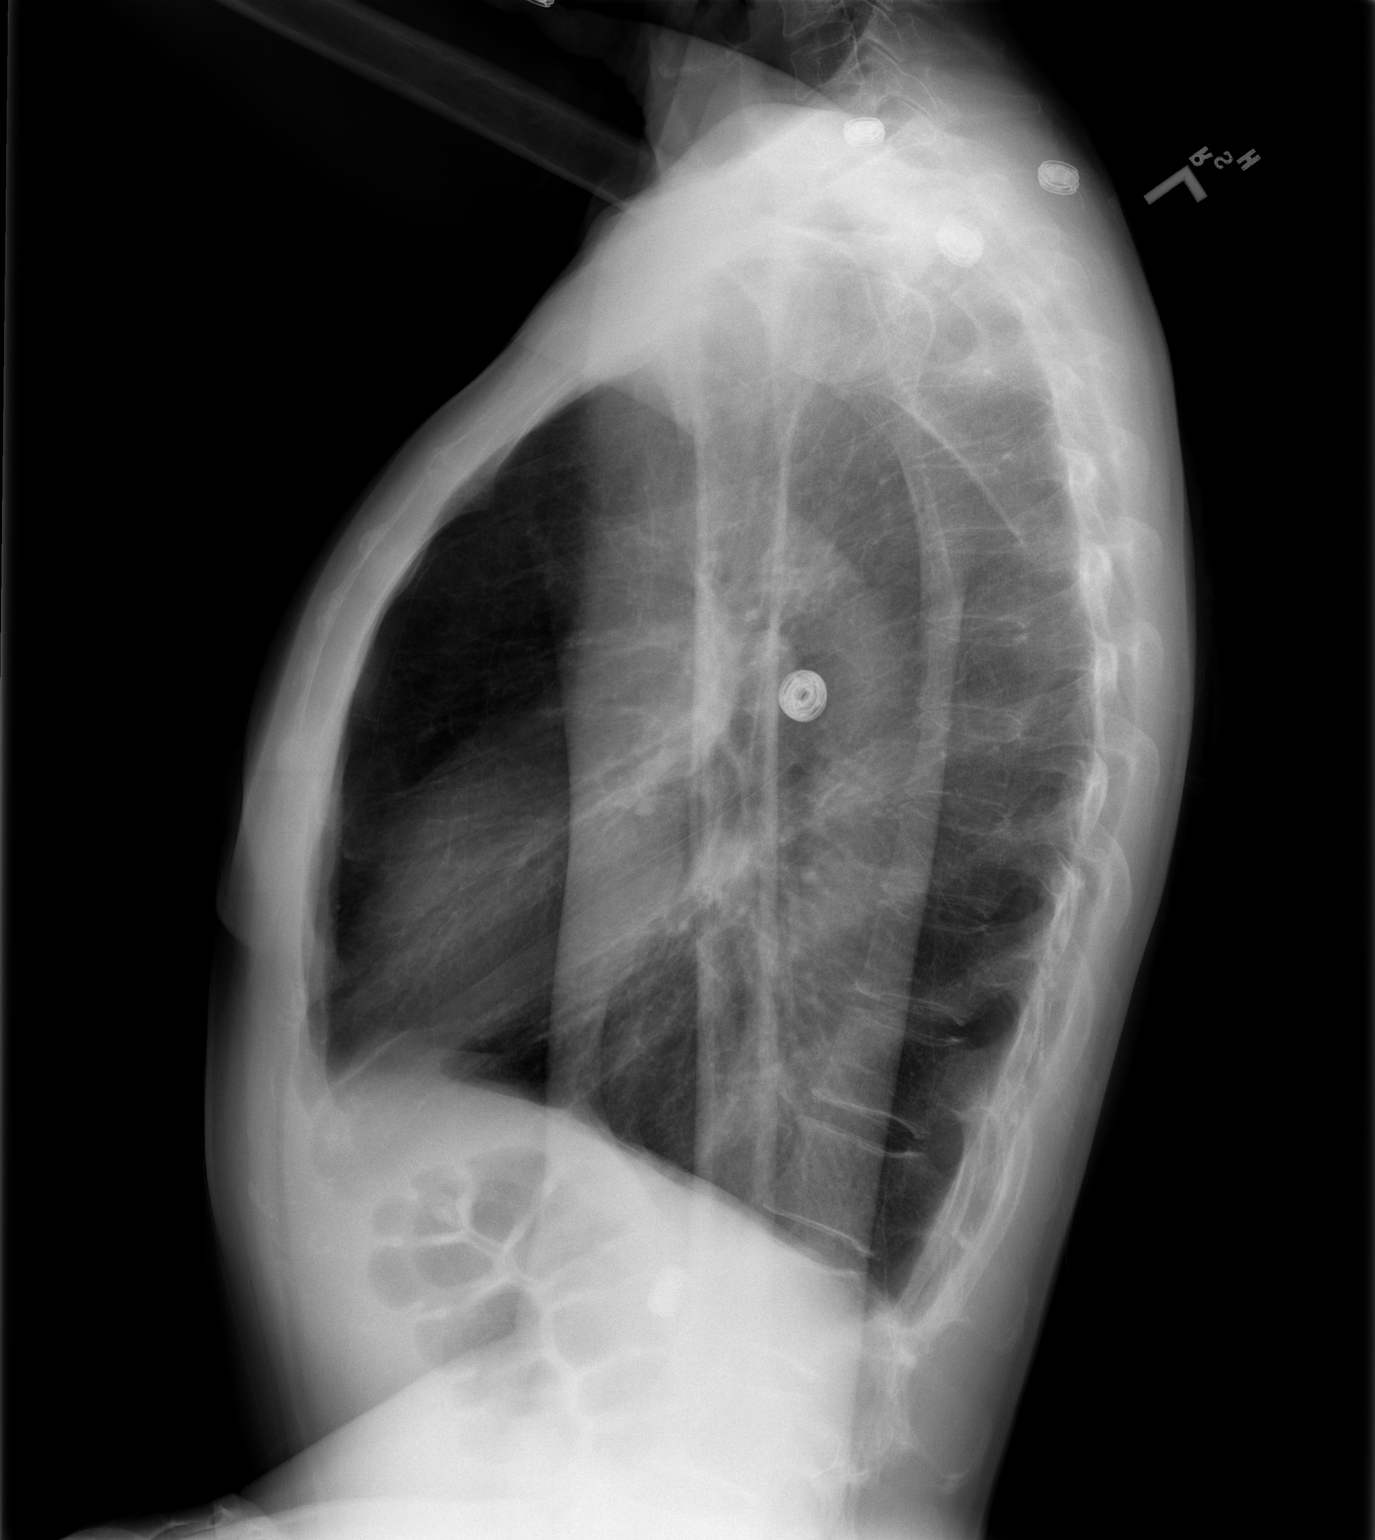

[2 of 2 positions shown; findings below may reference images not displayed]

FINDINGS: Comminuted proximal humerus fracture is noted on the
right.  Lungs are hyperinflated and there are coarsened
interstitial markings favoring COPD/emphysema.  The heart size is
normal.  No pleural effusion or edema.  No airspace consolidation
identified.
IMPRESSION: 1.  No active cardiopulmonary abnormalities.
2.  Acute fracture of the proximal right humerus

## 2021-02-18 ENCOUNTER — Emergency Department (HOSPITAL_COMMUNITY)
Admission: EM | Admit: 2021-02-18 | Discharge: 2021-02-18 | Disposition: A | Discharge status: Home / Self Care | Attending: Emergency Medicine | Admitting: Emergency Medicine

## 2021-02-18 ENCOUNTER — Encounter (HOSPITAL_COMMUNITY): Payer: Self-pay | Admitting: Emergency Medicine

## 2021-02-18 ENCOUNTER — Emergency Department (HOSPITAL_COMMUNITY)

## 2021-02-18 DIAGNOSIS — R000 Tachycardia, unspecified: Secondary | ICD-10-CM | POA: Diagnosis not present

## 2021-02-18 DIAGNOSIS — Z87891 Personal history of nicotine dependence: Secondary | ICD-10-CM | POA: Diagnosis not present

## 2021-02-18 DIAGNOSIS — J449 Chronic obstructive pulmonary disease, unspecified: Secondary | ICD-10-CM | POA: Insufficient documentation

## 2021-02-18 DIAGNOSIS — R0602 Shortness of breath: Secondary | ICD-10-CM | POA: Diagnosis present

## 2021-02-18 DIAGNOSIS — Z7951 Long term (current) use of inhaled steroids: Secondary | ICD-10-CM | POA: Insufficient documentation

## 2021-02-18 DIAGNOSIS — Z28310 Unvaccinated for covid-19: Secondary | ICD-10-CM | POA: Insufficient documentation

## 2021-02-18 DIAGNOSIS — J45909 Unspecified asthma, uncomplicated: Secondary | ICD-10-CM | POA: Insufficient documentation

## 2021-02-18 DIAGNOSIS — J189 Pneumonia, unspecified organism: Secondary | ICD-10-CM | POA: Diagnosis not present

## 2021-02-18 DIAGNOSIS — Z20822 Contact with and (suspected) exposure to covid-19: Secondary | ICD-10-CM | POA: Diagnosis not present

## 2021-02-18 LAB — CBC WITH DIFFERENTIAL/PLATELET
Abs Immature Granulocytes: 0.03 10*3/uL (ref 0.00–0.07)
Basophils Absolute: 0.1 10*3/uL (ref 0.0–0.1)
Basophils Relative: 1 %
Eosinophils Absolute: 0.1 10*3/uL (ref 0.0–0.5)
Eosinophils Relative: 1 %
HCT: 44.9 % (ref 36.0–46.0)
Hemoglobin: 14.4 g/dL (ref 12.0–15.0)
Immature Granulocytes: 0 %
Lymphocytes Relative: 33 %
Lymphs Abs: 3.4 10*3/uL (ref 0.7–4.0)
MCH: 30.3 pg (ref 26.0–34.0)
MCHC: 32.1 g/dL (ref 30.0–36.0)
MCV: 94.5 fL (ref 80.0–100.0)
Monocytes Absolute: 0.9 10*3/uL (ref 0.1–1.0)
Monocytes Relative: 8 %
Neutro Abs: 5.8 10*3/uL (ref 1.7–7.7)
Neutrophils Relative %: 57 %
Platelets: 403 10*3/uL — ABNORMAL HIGH (ref 150–400)
RBC: 4.75 MIL/uL (ref 3.87–5.11)
RDW: 12.7 % (ref 11.5–15.5)
WBC: 10.2 10*3/uL (ref 4.0–10.5)
nRBC: 0 % (ref 0.0–0.2)

## 2021-02-18 LAB — BASIC METABOLIC PANEL
Anion gap: 8 (ref 5–15)
BUN: 19 mg/dL (ref 6–20)
CO2: 23 mmol/L (ref 22–32)
Calcium: 9.2 mg/dL (ref 8.9–10.3)
Chloride: 110 mmol/L (ref 98–111)
Creatinine, Ser: 0.61 mg/dL (ref 0.44–1.00)
GFR, Estimated: >60 mL/min (ref >60)
Glucose, Bld: 127 mg/dL — ABNORMAL HIGH (ref 70–99)
Potassium: 3.9 mmol/L (ref 3.5–5.1)
Sodium: 141 mmol/L (ref 135–145)

## 2021-02-18 LAB — RESP PANEL BY RT-PCR (FLU A&B, COVID) ARPGX2
Influenza A by PCR: NEGATIVE
Influenza B by PCR: NEGATIVE
SARS Coronavirus 2 by RT PCR: NEGATIVE

## 2021-02-18 MED ORDER — AZITHROMYCIN 250 MG PO TABS
500.0000 mg | ORAL_TABLET | Freq: Once | ORAL | Status: AC
  Administered 2021-02-18: 500 mg via ORAL
  Filled 2021-02-18: qty 2

## 2021-02-18 MED ORDER — AZITHROMYCIN 250 MG PO TABS: 250.0000 mg | ORAL_TABLET | Freq: Every day | ORAL | 0 refills | Status: AC

## 2021-02-18 MED ORDER — MAGNESIUM SULFATE 2 GM/50ML IV SOLN
2.0000 g | Freq: Once | INTRAVENOUS | Status: AC
  Administered 2021-02-18: 2 g via INTRAVENOUS
  Filled 2021-02-18: qty 50

## 2021-02-18 MED ORDER — IPRATROPIUM-ALBUTEROL 0.5-2.5 (3) MG/3ML IN SOLN
3.0000 mL | Freq: Once | RESPIRATORY_TRACT | Status: AC
  Administered 2021-02-18: 3 mL via RESPIRATORY_TRACT
  Filled 2021-02-18: qty 3

## 2021-02-18 MED ORDER — PREDNISONE 20 MG PO TABS: 60.0000 mg | ORAL_TABLET | Freq: Every day | ORAL | 0 refills | Status: AC

## 2021-02-18 NOTE — Discharge Instructions (Signed)
You are seen in the emergency department for acute shortness of breath.  Your lab work was unremarkable and your COVID and flu testing were negative.  Your chest x-ray showed possible pneumonia.  We are treating you with antibiotics and prednisone.  Please continue your inhaler 2 puffs every 4 hours as needed.  Limit or stop smoking.  Follow-up with your doctor and return to the emergency department if any worsening or concerning symptoms

## 2021-02-18 NOTE — ED Provider Notes (Signed)
[facility]-EMERGENCY DEPT Provider Note   CSN: [id number] Arrival date & time: 02/18/21  1615     History No chief complaint on file.   [name] is a 55 y.o. female.  [facility] for acute shortness of breath.  Symptoms started about an hour prior to arrival.  She has a history of tobacco use and COPD.  She received 15 mg of albuterol by nebulizer along with Atrovent and Solu-Medrol.  Complaining of nonproductive cough and feeling like her throat is closing off.  No vomiting or diarrhea.  No fevers or chills.  She is not COVID vaccinated.  The history is provided by the patient and the EMS personnel.  Shortness of Breath Severity:  Severe Onset quality:  Sudden Duration:  1 hour Timing:  Constant Progression:  Unchanged Chronicity:  Recurrent Relieved by:  Nothing Worsened by:  Coughing Ineffective treatments:  None tried Associated symptoms: cough   Associated symptoms: no abdominal pain, no chest pain, no fever, no headaches, no hemoptysis, no neck pain, no rash, no sore throat, no sputum production and no vomiting   Risk factors: tobacco use       Past Medical History:  Diagnosis Date   ASTHMA 03/18/2010   C O P D 03/18/2010   GERD (gastroesophageal reflux disease)     Patient Active Problem List   Diagnosis Date Noted   Proximal humerus fracture, right 10/12/2012   ASTHMA 03/18/2010   C O P D 03/18/2010    Past Surgical History:  Procedure Laterality Date   BONY PELVIS SURGERY     7 yrs   ORIF HUMERUS FRACTURE Right 10/12/2012   Procedure: OPEN REDUCTION INTERNAL FIXATION (ORIF) PROXIMAL HUMERUS FRACTURE;  Surgeon: Budd Palmer, MD;  Location: [facility] OR;  Service: Orthopedics;  Laterality: Right;   TUBAL LIGATION       OB History   No obstetric history on file.     History reviewed. No pertinent family history.  Social History   Tobacco Use   Smoking status: Former    Packs/day: 0.50    Pack years: 0.00     Types: Cigarettes    Quit date: 09/22/2012    Years since quitting: 8.4  Substance Use Topics   Alcohol use: Yes    Comment: occ beer   Drug use: No    Home Medications Prior to Admission medications   Medication Sig Start Date End Date Taking? Authorizing Provider  albuterol (PROVENTIL HFA;VENTOLIN HFA) 108 (90 BASE) MCG/ACT inhaler Inhale 2 puffs into the lungs every 4 (four) hours as needed for wheezing.    [provider]  albuterol (PROVENTIL) (2.5 MG/3ML) 0.083% nebulizer solution Take 2.5 mg by nebulization every 4 (four) hours as needed for wheezing or shortness of breath.    [provider]  FLUoxetine (PROZAC) 10 MG capsule Take 10 mg by mouth daily.    [provider]  Fluticasone-Salmeterol (ADVAIR) 500-50 MCG/DOSE AEPB Inhale 1 puff into the lungs daily.    [provider]  nitrofurantoin, macrocrystal-monohydrate, (MACROBID) 100 MG capsule Take 1 capsule (100 mg total) by mouth once. 11/21/12   Earley Favor, NP  ondansetron (ZOFRAN) 4 MG tablet Take 1 tablet (4 mg total) by mouth every 8 (eight) hours as needed for nausea. 11/21/12   Earley Favor, NP  oxyCODONE-acetaminophen (PERCOCET/ROXICET) 5-325 MG per tablet Take 1-2 tablets by mouth every 6 (six) hours as needed for pain. 10/13/12   Montez Morita, PA-C  Allergies    Ceftriaxone sodium and Codeine  Review of Systems   Review of Systems  Constitutional:  Negative for fever.  HENT:  Negative for sore throat.   Eyes:  Negative for visual disturbance.  Respiratory:  Positive for cough and shortness of breath. Negative for hemoptysis and sputum production.   Cardiovascular:  Negative for chest pain.  Gastrointestinal:  Negative for abdominal pain and vomiting.  Genitourinary:  Negative for dysuria.  Musculoskeletal:  Negative for neck pain.  Skin:  Negative for rash.  Neurological:  Negative for headaches.   Physical Exam Updated Vital Signs BP (!) 136/94 (BP Location: Right Arm)    Pulse 87   Temp 97.6 F (36.4 C) (Oral)   Resp (!) 22   SpO2 100%   Physical Exam Vitals and nursing note reviewed.  Constitutional:      General: She is not in acute distress.    Appearance: Normal appearance. She is well-developed.  HENT:     Head: Normocephalic and atraumatic.  Eyes:     Conjunctiva/sclera: Conjunctivae normal.  Cardiovascular:     Rate and Rhythm: Regular rhythm. Tachycardia present.     Heart sounds: No murmur heard. Pulmonary:     Effort: Pulmonary effort is normal. No respiratory distress.     Breath sounds: Rhonchi present.  Abdominal:     Palpations: Abdomen is soft.     Tenderness: There is no abdominal tenderness.  Musculoskeletal:        General: Normal range of motion.     Cervical back: Neck supple.     Right lower leg: No edema.     Left lower leg: No edema.  Skin:    General: Skin is warm and dry.  Neurological:     General: No focal deficit present.     Mental Status: She is alert.    ED Results / Procedures / Treatments   Labs (all labs ordered are listed, but only abnormal results are displayed) Labs Reviewed  BASIC METABOLIC PANEL - Abnormal; Notable for the following components:      Result Value   Glucose, Bld 127 (*)    All other components within normal limits  CBC WITH DIFFERENTIAL/PLATELET - Abnormal; Notable for the following components:   Platelets 403 (*)    All other components within normal limits  RESP PANEL BY RT-PCR (FLU A&B, COVID) [id number]    EKG EKG Interpretation  Date/Time:  Monday February 18 2021 16:29:12 EDT Ventricular Rate:  74 PR Interval:  116 QRS Duration: 70 QT Interval:  380 QTC Calculation: 421 R Axis:   93 Text Interpretation: Normal sinus rhythm Rightward axis Cannot rule out Anterior infarct , age undetermined Abnormal ECG No significant change since prior 2/14 Confirmed by Meridee Score [id number]) on 02/18/2021 4:35:02 PM  Radiology DG Chest Port 1 View  Result Date: 02/18/2021 CLINICAL DATA:   Shortness of breath EXAM: PORTABLE CHEST 1 VIEW COMPARISON:  10/13/2020 FINDINGS: Hyperinflation. Small vague foci of opacity in the left greater than right upper lungs. Normal cardiomediastinal silhouette. No pneumothorax. IMPRESSION: 1. Small foci of vague airspace disease in the left greater than right upper lungs questionable for small foci of pneumonia. Radiographic follow-up to resolution is recommended. Electronically Signed   By: Jasmine Pang M.D.   On: 02/18/2021 16:52    Procedures Procedures   Medications Ordered in ED Medications  magnesium sulfate IVPB 2 g 50 mL (0 g Intravenous Stopped 02/18/21 1746)  azithromycin (ZITHROMAX) tablet 500 mg (500  mg Oral Given 02/18/21 1857)  ipratropium-albuterol (DUONEB) 0.5-2.5 (3) MG/3ML nebulizer solution 3 mL (3 mLs Nebulization Given 02/18/21 1840)    ED Course  I have reviewed the triage vital signs and the nursing notes.  Pertinent labs & imaging results that were available during my care of the patient were reviewed by me and considered in my medical decision making (see chart for details).  Clinical Course as of 02/19/21 1140  Mon Feb 18, 2021  1658 Chest x-ray possibly showing some left-sided infiltrate.  Awaiting radiology reading. [MB]    Clinical Course User Index [MB] Terrilee Files, MD   MDM Rules/Calculators/A&P                         Rosaura Bolon was evaluated in Emergency Department on 02/18/2021 for the symptoms described in the history of present illness. She was evaluated in the context of the global COVID-19 pandemic, which necessitated consideration that the patient might be at risk for infection with the SARS-CoV-2 virus that causes COVID-19. Institutional protocols and algorithms that pertain to the evaluation of patients at risk for COVID-19 are in a state of rapid change based on information released by regulatory bodies including the CDC and federal and state organizations. These policies and algorithms were followed  during the patient's care in the ED.  This patient complains of acute shortness of breath; this involves an extensive number of treatment Options and is a complaint that carries with it a high risk of complications and Morbidity. The differential includes COPD, CHF, PE, pneumothorax, bronchitis, pneumonia, COVID  I ordered, reviewed and interpreted labs, which included CBC with normal white count normal hemoglobin, chemistries normal other than mildly elevated glucose, COVID and flu testing negative I ordered medication IV magnesium, DuoNeb I ordered imaging studies which included chest x-ray and I independently    visualized and interpreted imaging which showed possible early infiltrate left side Additional history obtained from EMS Previous records obtained and reviewed in epic, no recent admissions  After the interventions stated above, I reevaluated the patient and found patient to be resting more comfortably.  Still with mild work of breathing.  She understands use of an inhaler will prescribe antibiotics and steroids.  Return instructions discussed   Final Clinical Impression(s) / ED Diagnoses Final diagnoses:  Community acquired pneumonia of left lung, unspecified part of lung  Chronic obstructive pulmonary disease, unspecified COPD type (HCC)    Rx / DC Orders ED Discharge Orders          Ordered    azithromycin (ZITHROMAX) 250 MG tablet  Daily        02/18/21 1906    predniSONE (DELTASONE) 20 MG tablet  Daily        02/18/21 1906             Terrilee Files, MD 02/19/21 1143

## 2021-02-18 NOTE — ED Triage Notes (Signed)
Per EMS, patient from [facility], respiratory distress x 1 hour. Hx COPD. 15mg  Albuterol prior to EMS arrival. 5mg  Albuterol, 0.5mg  atrovent, 125mg  Solumedrol with EMS.  20g R AC

## 2021-12-29 IMAGING — DX DG CHEST 1V PORT
1 series · 1 of 1 positions shown · non-contrast
Comparison: 10/13/2020

CLINICAL DATA: Shortness of breath

EXAM:
PORTABLE CHEST 1 VIEW

[chest ap]
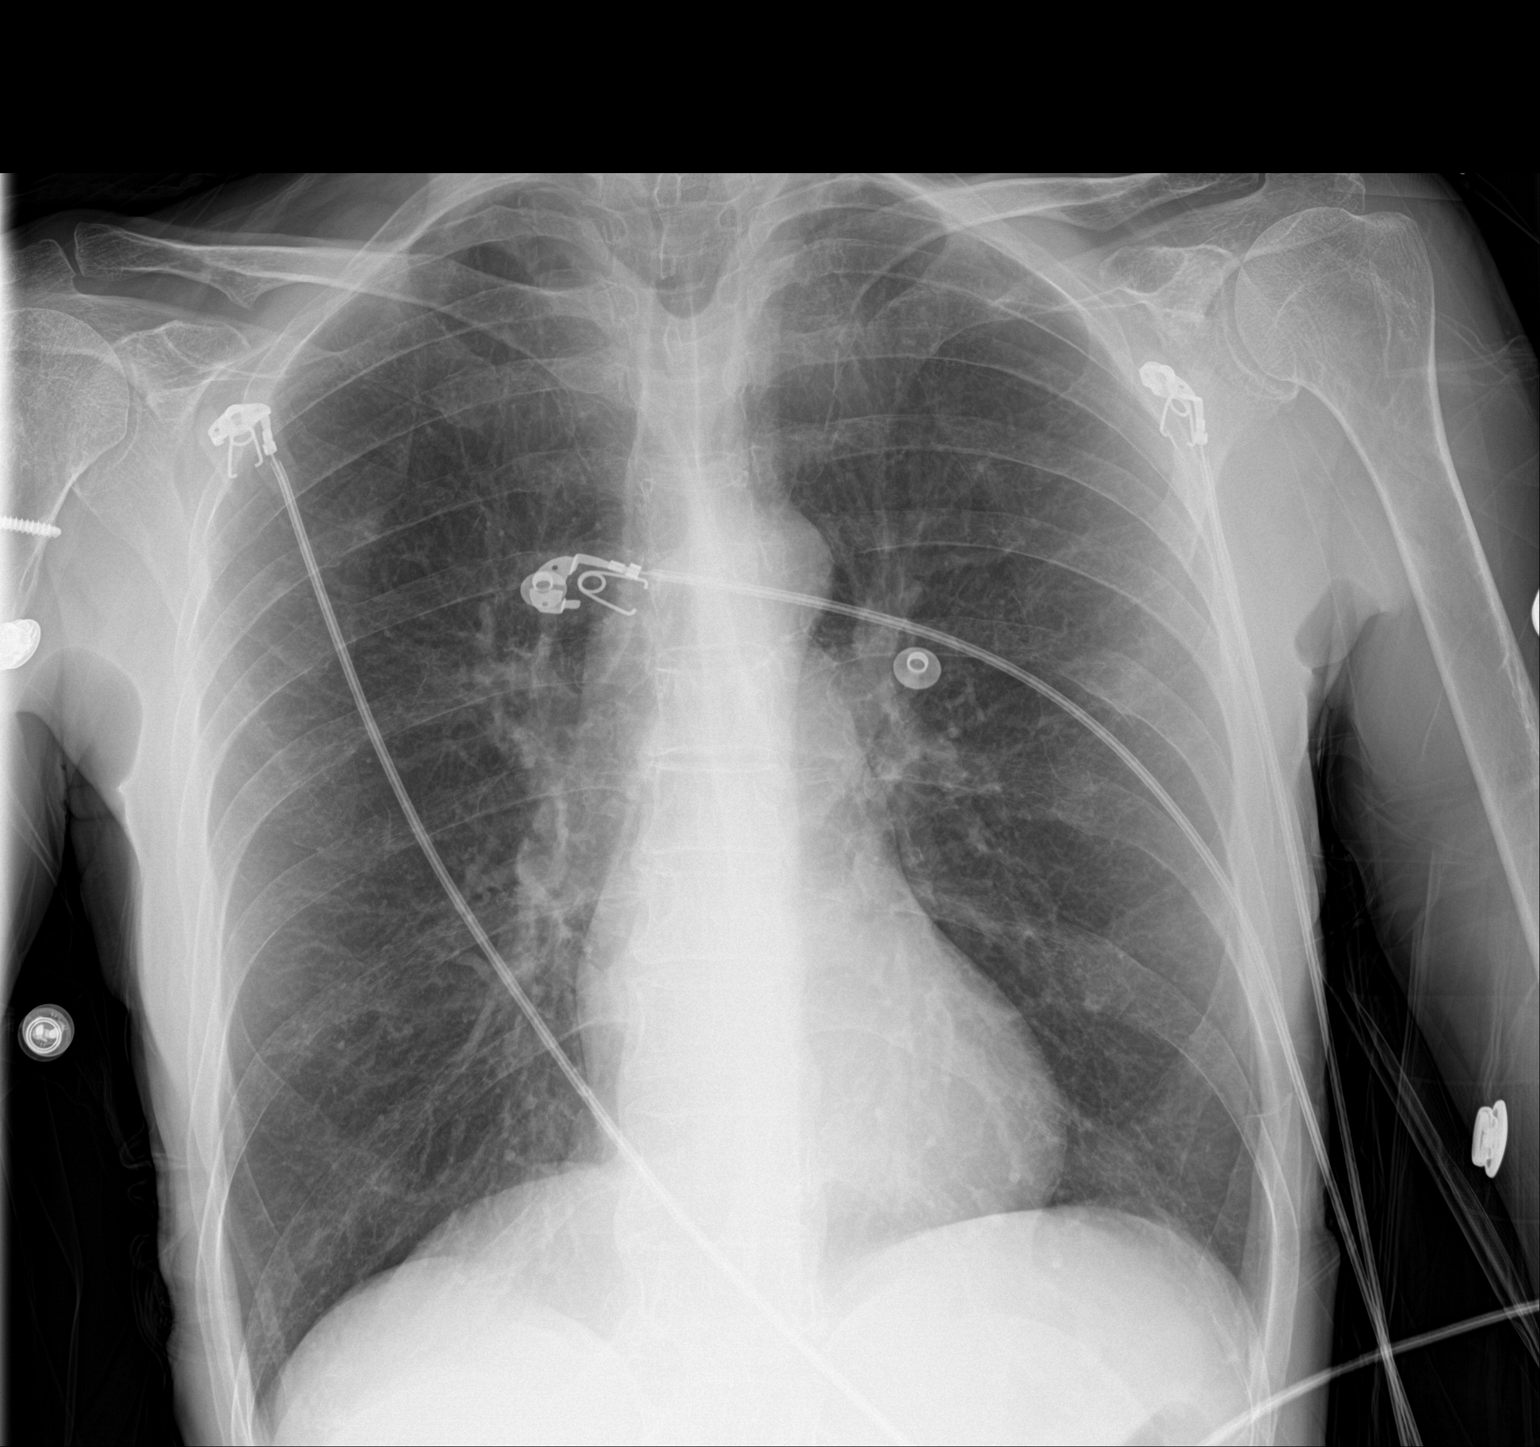

[1 of 1 positions shown; findings below may reference images not displayed]

FINDINGS: Hyperinflation. Small vague foci of opacity in the left greater than
right upper lungs. Normal cardiomediastinal silhouette. No
pneumothorax.
IMPRESSION: 1. Small foci of vague airspace disease in the left greater than
right upper lungs questionable for small foci of pneumonia.
Radiographic follow-up to resolution is recommended.
